# Patient Record
Sex: Male | Born: 1950 | Race: White | Hispanic: No | Marital: Married | Smoking: Former smoker
Health system: Southern US, Community
[De-identification: ages and names within clinical notes are randomized; demographics above are authoritative.]

## PROBLEM LIST (undated history)

## (undated) DIAGNOSIS — K469 Unspecified abdominal hernia without obstruction or gangrene: Secondary | ICD-10-CM

## (undated) DIAGNOSIS — I82409 Acute embolism and thrombosis of unspecified deep veins of unspecified lower extremity: Secondary | ICD-10-CM

## (undated) DIAGNOSIS — N200 Calculus of kidney: Secondary | ICD-10-CM

## (undated) DIAGNOSIS — I4891 Unspecified atrial fibrillation: Secondary | ICD-10-CM

## (undated) DIAGNOSIS — I10 Essential (primary) hypertension: Secondary | ICD-10-CM

## (undated) HISTORY — PX: KIDNEY STONE SURGERY: SHX686

## (undated) HISTORY — PX: HERNIA REPAIR: SHX51

---

## 1999-11-09 ENCOUNTER — Encounter: Payer: Self-pay | Admitting: Emergency Medicine

## 1999-11-09 ENCOUNTER — Encounter: Admission: RE | Admit: 1999-11-09 | Discharge: 1999-11-09 | Payer: Self-pay | Admitting: Emergency Medicine

## 2002-04-19 ENCOUNTER — Ambulatory Visit (HOSPITAL_COMMUNITY): Admission: RE | Admit: 2002-04-19 | Discharge: 2002-04-19 | Payer: Self-pay | Admitting: Emergency Medicine

## 2002-10-18 ENCOUNTER — Ambulatory Visit: Admission: RE | Admit: 2002-10-18 | Discharge: 2002-10-18 | Payer: Self-pay | Admitting: Emergency Medicine

## 2003-02-10 ENCOUNTER — Encounter: Admission: RE | Admit: 2003-02-10 | Discharge: 2003-02-10 | Payer: Self-pay | Admitting: Emergency Medicine

## 2003-02-10 ENCOUNTER — Encounter: Payer: Self-pay | Admitting: Emergency Medicine

## 2003-10-08 ENCOUNTER — Encounter (INDEPENDENT_AMBULATORY_CARE_PROVIDER_SITE_OTHER): Payer: Self-pay | Admitting: *Deleted

## 2003-10-08 ENCOUNTER — Ambulatory Visit (HOSPITAL_COMMUNITY): Admission: RE | Admit: 2003-10-08 | Discharge: 2003-10-08 | Payer: Self-pay | Admitting: *Deleted

## 2004-01-28 ENCOUNTER — Encounter: Admission: RE | Admit: 2004-01-28 | Discharge: 2004-01-28 | Payer: Self-pay | Admitting: Emergency Medicine

## 2004-01-29 ENCOUNTER — Emergency Department (HOSPITAL_COMMUNITY): Admission: EM | Admit: 2004-01-29 | Discharge: 2004-01-29 | Payer: Self-pay | Admitting: Emergency Medicine

## 2004-05-03 ENCOUNTER — Encounter: Admission: RE | Admit: 2004-05-03 | Discharge: 2004-05-03 | Payer: Self-pay | Admitting: Emergency Medicine

## 2009-05-25 ENCOUNTER — Emergency Department (HOSPITAL_BASED_OUTPATIENT_CLINIC_OR_DEPARTMENT_OTHER): Admission: EM | Admit: 2009-05-25 | Discharge: 2009-05-25 | Payer: Self-pay | Admitting: Emergency Medicine

## 2009-05-25 ENCOUNTER — Ambulatory Visit: Payer: Self-pay | Admitting: Diagnostic Radiology

## 2010-10-15 NOTE — Op Note (Signed)
NAME:  Peter Peters, Peter Peters                        ACCOUNT NO.:  [id number]   MEDICAL RECORD NO.:  [id number]                   PATIENT TYPE:  OIB   LOCATION:  2899                                 FACILITY:  [facility]   PHYSICIAN:  Balinda Quails, M.D.                 DATE OF BIRTH:  [birth year]   DATE OF PROCEDURE:  10/08/2003  DATE OF DISCHARGE:  10/08/2003                                 OPERATIVE REPORT   PREOPERATIVE DIAGNOSIS:  Varicose veins, right lower extremity.   POSTOPERATIVE DIAGNOSIS:  Varicose veins, right lower extremity.   PROCEDURES:  1. Ligation and stripping, right greater saphenous vein.  2. Ligation and stripping of multiple venous varicosities, right lower     extremity.   SURGEON:  Balinda Quails, M.D.   ASSISTANT:  Jerold Coombe, P.A.   ANESTHESIA:  General endotracheal.   ANESTHESIOLOGIST:  Sheldon Silvan, M.D.   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  Evaluation revealed extensive varicosities including right greater saphenous  incompetence.   He is brought to the operating room at this time for ligation and stripping  of the greater saphenous vein in the right lower extremity along with  multiple varicose tributaries.   PROCEDURE NOTE:  Patient brought to the operating room and stable condition.  Placed in the supine position.  General endotracheal anesthesia induced.  Right leg prepped and draped in a sterile fashion.   Oblique skin incision made in the right groin over the saphenofemoral  junction.  Dissection carried down through the subcutaneous tissue.  The  saphenofemoral junction exposed.  Tributaries of the saphenous vein ligated  with 3-0 silk and divided.  The saphenofemoral junction was clearly exposed.  The saphenous vein encircled with a 2-0 silk and ligated at the  saphenofemoral junction.   A skin incision was then made over the greater  saphenous vein in the  proximal calf.  Dissection carried down to expose the vein.  This was freed,  ligated distally with 2-0 silk.  The vein then opened transversely.  A  plastic stripper was passed up the vein, however could only be passed to the  mid-thigh level.  A transverse skin incision was made at the mid-thigh  level.  The saphenous vein further exposed.  The vein mobilized, stripper  brought through this level, and the segment stripped to the mid-thigh.  The  stripper was then placed proximally through the vein up to the  saphenofemoral junction.  The vein was divided at the saphenofemoral  junction and the proximal thigh portion was stripped completely.   Pre-marked sites were then opened through small transverse step-ladder  incisions and varicosities were stripped and ligated with 3-0 silk.   The wounds were irrigated with saline solution.  The groin incision closed  with running 3-0 Vicryl in a subcutaneous layer  and 4-0 Monocryl in the  skin.  The transverse incisions were closed with a single dermal layer of  interrupted 4-0 Vicryl suture.  Steri-Strips were applied.   A dressing of 4 x 4's, Kerlix, and Ace wrap was applied to the leg.   There were no apparent complications.  The patient transferred to the  recovery room in stable condition.                                               Balinda Quails, M.D.    [facility]MED[facility]  D:  11/13/2003  T:  11/13/2003  Job:  [id number]   cc:   Reuben Likes, M.D.  [address]  [place]  [place] [address]  Fax: [phone]

## 2014-03-31 ENCOUNTER — Emergency Department (HOSPITAL_BASED_OUTPATIENT_CLINIC_OR_DEPARTMENT_OTHER): Payer: BC Managed Care – PPO

## 2014-03-31 ENCOUNTER — Emergency Department (HOSPITAL_BASED_OUTPATIENT_CLINIC_OR_DEPARTMENT_OTHER)
Admission: EM | Admit: 2014-03-31 | Discharge: 2014-03-31 | Disposition: A | Discharge status: Home / Self Care | Payer: BC Managed Care – PPO | Attending: Emergency Medicine | Admitting: Emergency Medicine

## 2014-03-31 ENCOUNTER — Encounter (HOSPITAL_BASED_OUTPATIENT_CLINIC_OR_DEPARTMENT_OTHER): Payer: Self-pay

## 2014-03-31 DIAGNOSIS — R1031 Right lower quadrant pain: Secondary | ICD-10-CM

## 2014-03-31 DIAGNOSIS — K5792 Diverticulitis of intestine, part unspecified, without perforation or abscess without bleeding: Secondary | ICD-10-CM

## 2014-03-31 DIAGNOSIS — Z9889 Other specified postprocedural states: Secondary | ICD-10-CM | POA: Diagnosis not present

## 2014-03-31 DIAGNOSIS — Z87442 Personal history of urinary calculi: Secondary | ICD-10-CM | POA: Insufficient documentation

## 2014-03-31 HISTORY — DX: Calculus of kidney: N20.0

## 2014-03-31 LAB — URINALYSIS, ROUTINE W REFLEX MICROSCOPIC
Bilirubin Urine: NEGATIVE
Glucose, UA: NEGATIVE mg/dL
HGB URINE DIPSTICK: NEGATIVE
Ketones, ur: NEGATIVE mg/dL
Nitrite: NEGATIVE
PH: 7 (ref 5.0–8.0)
PROTEIN: NEGATIVE mg/dL
SPECIFIC GRAVITY, URINE: 1.021 (ref 1.005–1.030)
UROBILINOGEN UA: 1 mg/dL (ref 0.0–1.0)

## 2014-03-31 LAB — COMPREHENSIVE METABOLIC PANEL
ALT: 23 U/L (ref 0–53)
ANION GAP: 10 (ref 5–15)
AST: 20 U/L (ref 0–37)
Albumin: 4 g/dL (ref 3.5–5.2)
Alkaline Phosphatase: 59 U/L (ref 39–117)
BUN: 21 mg/dL (ref 6–23)
CALCIUM: 9.3 mg/dL (ref 8.4–10.5)
CO2: 26 mEq/L (ref 19–32)
Chloride: 105 mEq/L (ref 96–112)
Creatinine, Ser: 0.9 mg/dL (ref 0.50–1.35)
GFR calc Af Amer: >90 mL/min (ref >90)
Glucose, Bld: 98 mg/dL (ref 70–99)
POTASSIUM: 4.6 meq/L (ref 3.7–5.3)
SODIUM: 141 meq/L (ref 137–147)
Total Bilirubin: 0.6 mg/dL (ref 0.3–1.2)
Total Protein: 6.7 g/dL (ref 6.0–8.3)

## 2014-03-31 LAB — URINE MICROSCOPIC-ADD ON

## 2014-03-31 LAB — CBC WITH DIFFERENTIAL/PLATELET
BASOS ABS: 0 10*3/uL (ref 0.0–0.1)
BASOS PCT: 1 % (ref 0–1)
EOS ABS: 0.1 10*3/uL (ref 0.0–0.7)
Eosinophils Relative: 2 % (ref 0–5)
HCT: 43.4 % (ref 39.0–52.0)
HEMOGLOBIN: 15.6 g/dL (ref 13.0–17.0)
LYMPHS ABS: 1.7 10*3/uL (ref 0.7–4.0)
Lymphocytes Relative: 23 % (ref 12–46)
MCH: 30.6 pg (ref 26.0–34.0)
MCHC: 35.9 g/dL (ref 30.0–36.0)
MCV: 85.3 fL (ref 78.0–100.0)
MONOS PCT: 7 % (ref 3–12)
Monocytes Absolute: 0.5 10*3/uL (ref 0.1–1.0)
NEUTROS ABS: 4.8 10*3/uL (ref 1.7–7.7)
NEUTROS PCT: 67 % (ref 43–77)
PLATELETS: 209 10*3/uL (ref 150–400)
RBC: 5.09 MIL/uL (ref 4.22–5.81)
RDW: 13 % (ref 11.5–15.5)
WBC: 7.1 10*3/uL (ref 4.0–10.5)

## 2014-03-31 MED ORDER — IOHEXOL 300 MG/ML  SOLN
50.0000 mL | Freq: Once | INTRAMUSCULAR | Status: AC | PRN
  Administered 2014-03-31: 50 mL via ORAL

## 2014-03-31 MED ORDER — IOHEXOL 300 MG/ML  SOLN
100.0000 mL | Freq: Once | INTRAMUSCULAR | Status: AC | PRN
  Administered 2014-03-31: 100 mL via INTRAVENOUS

## 2014-03-31 MED ORDER — HYDROCODONE-ACETAMINOPHEN 5-325 MG PO TABS: 1.0000 | ORAL_TABLET | Freq: Four times a day (QID) | ORAL | Status: DC | PRN

## 2014-03-31 MED ORDER — CIPROFLOXACIN HCL 500 MG PO TABS: 500.0000 mg | ORAL_TABLET | Freq: Two times a day (BID) | ORAL | Status: DC

## 2014-03-31 MED ORDER — METRONIDAZOLE 500 MG PO TABS: 500.0000 mg | ORAL_TABLET | Freq: Three times a day (TID) | ORAL | Status: DC

## 2014-03-31 NOTE — ED Notes (Signed)
RLQ pain started 10pm last night-denies n/v/d

## 2014-03-31 NOTE — ED Provider Notes (Signed)
CSN: [id number]     Arrival date & time 03/31/14  1409 History   First MD Initiated Contact with Patient 03/31/14 1418     Chief Complaint  Patient presents with  . Abdominal Pain     (Consider location/radiation/quality/duration/timing/severity/associated sxs/prior Treatment) HPI Comments: Patient is a 63 year old male with history of renal calculi and hernia repair. He presents today with complaints of pain in his right lower quadrant that started yesterday evening. This is worsened into this morning. He denies any fevers or chills. He denies any nausea, vomiting, or diarrhea. This does not feel like a kidney stone.  Patient is a 63 y.o. male presenting with abdominal pain. The history is provided by the patient.  Abdominal Pain Pain location:  RLQ Pain quality: cramping   Pain radiates to:  Does not radiate Pain severity:  Moderate Onset quality:  Gradual Duration:  12 hours Timing:  Constant Progression:  Worsening Chronicity:  New Relieved by:  Nothing Worsened by:  Nothing tried Ineffective treatments:  None tried   Past Medical History  Diagnosis Date  . Kidney stone    Past Surgical History  Procedure Laterality Date  . Hernia repair     No family history on file. History  Substance Use Topics  . Smoking status: Never Smoker   . Smokeless tobacco: Not on file  . Alcohol Use: Yes    Review of Systems  Gastrointestinal: Positive for abdominal pain.  All other systems reviewed and are negative.     Allergies  Review of patient's allergies indicates no known allergies.  Home Medications   Prior to Admission medications   Not on File   BP 137/91 mmHg  Pulse 99  Temp(Src) 98 F (36.7 C) (Oral)  Resp 18  Ht 6' (1.829 m)  Wt 220 lb (99.791 kg)  BMI 29.83 kg/m2  SpO2 98% Physical Exam  Constitutional: He is oriented to person, place, and time. He appears well-developed and well-nourished. No distress.  HENT:  Head: Normocephalic and atraumatic.   Neck: Normal range of motion. Neck supple.  Cardiovascular: Normal rate and regular rhythm.   No murmur heard. Pulmonary/Chest: Effort normal and breath sounds normal. No respiratory distress. He has no wheezes.  Abdominal: Soft. Bowel sounds are normal. He exhibits no distension. There is tenderness. There is no rebound and no guarding.  There is tenderness to palpation in the right lower quadrant.  Musculoskeletal: Normal range of motion.  Neurological: He is alert and oriented to person, place, and time.  Skin: Skin is warm and dry. He is not diaphoretic.  Nursing note and vitals reviewed.   ED Course  Procedures (including critical care time) Labs Review Labs Reviewed  COMPREHENSIVE METABOLIC PANEL  CBC WITH DIFFERENTIAL  URINALYSIS, ROUTINE W REFLEX MICROSCOPIC    Imaging Review No results found.   EKG Interpretation None      MDM   Final diagnoses:  None    Patient is a 63 year old male who presents with right lower quadrant pain since last night. He has no fever and no white count. CT scan reveals, however is suggestive of diverticulitis. He will be treated with Cipro and Flagyl and when necessary follow-up with his primary doctor. He understands to return if his symptoms worsen or change.    Geoffery Lyonsouglas Dontrail Blackwell, MD 03/31/14 [phone]

## 2014-03-31 NOTE — Discharge Instructions (Signed)
Cipro and Flagyl as prescribed.  Hydrocodone as prescribed as needed for pain.  Return to the emergency department if you develop worsening pain, high fever, bloody stool, or other new and concerning symptoms.   Diverticulitis Diverticulitis is inflammation or infection of small pouches in your colon that form when you have a condition called diverticulosis. The pouches in your colon are called diverticula. Your colon, or large intestine, is where water is absorbed and stool is formed. Complications of diverticulitis can include:  Bleeding.  Severe infection.  Severe pain.  Perforation of your colon.  Obstruction of your colon. CAUSES  Diverticulitis is caused by bacteria. Diverticulitis happens when stool becomes trapped in diverticula. This allows bacteria to grow in the diverticula, which can lead to inflammation and infection. RISK FACTORS People with diverticulosis are at risk for diverticulitis. Eating a diet that does not include enough fiber from fruits and vegetables may make diverticulitis more likely to develop. SYMPTOMS  Symptoms of diverticulitis may include:  Abdominal pain and tenderness. The pain is normally located on the left side of the abdomen, but may occur in other areas.  Fever and chills.  Bloating.  Cramping.  Nausea.  Vomiting.  Constipation.  Diarrhea.  Blood in your stool. DIAGNOSIS  Your health care provider will ask you about your medical history and do a physical exam. You may need to have tests done because many medical conditions can cause the same symptoms as diverticulitis. Tests may include:  Blood tests.  Urine tests.  Imaging tests of the abdomen, including X-rays and CT scans. When your condition is under control, your health care provider may recommend that you have a colonoscopy. A colonoscopy can show how severe your diverticula are and whether something else is causing your symptoms. TREATMENT  Most cases of  diverticulitis are mild and can be treated at home. Treatment may include:  Taking over-the-counter pain medicines.  Following a clear liquid diet.  Taking antibiotic medicines by mouth for 7-10 days. More severe cases may be treated at a hospital. Treatment may include:  Not eating or drinking.  Taking prescription pain medicine.  Receiving antibiotic medicines through an IV tube.  Receiving fluids and nutrition through an IV tube.  Surgery. HOME CARE INSTRUCTIONS   Follow your health care provider's instructions carefully.  Follow a full liquid diet or other diet as directed by your health care provider. After your symptoms improve, your health care provider may tell you to change your diet. He or she may recommend you eat a high-fiber diet. Fruits and vegetables are good sources of fiber. Fiber makes it easier to pass stool.  Take fiber supplements or probiotics as directed by your health care provider.  Only take medicines as directed by your health care provider.  Keep all your follow-up appointments. SEEK MEDICAL CARE IF:   Your pain does not improve.  You have a hard time eating food.  Your bowel movements do not return to normal. SEEK IMMEDIATE MEDICAL CARE IF:   Your pain becomes worse.  Your symptoms do not get better.  Your symptoms suddenly get worse.  You have a fever.  You have repeated vomiting.  You have bloody or black, tarry stools. MAKE SURE YOU:   Understand these instructions.  Will watch your condition.  Will get help right away if you are not doing well or get worse. Document Released: 02/23/2005 Document Revised: 05/21/2013 Document Reviewed: 04/10/2013 [facility] Patient Information 2015 [employer] [place]. This information is not intended to replace  advice given to you by your health care provider. Make sure you discuss any questions you have with your health care provider. ° °

## 2014-09-25 ENCOUNTER — Emergency Department (HOSPITAL_BASED_OUTPATIENT_CLINIC_OR_DEPARTMENT_OTHER)
Admission: EM | Admit: 2014-09-25 | Discharge: 2014-09-25 | Disposition: A | Discharge status: Home / Self Care | Payer: Worker's Compensation | Attending: Emergency Medicine | Admitting: Emergency Medicine

## 2014-09-25 ENCOUNTER — Encounter (HOSPITAL_BASED_OUTPATIENT_CLINIC_OR_DEPARTMENT_OTHER): Payer: Self-pay | Admitting: Emergency Medicine

## 2014-09-25 DIAGNOSIS — Z8719 Personal history of other diseases of the digestive system: Secondary | ICD-10-CM | POA: Insufficient documentation

## 2014-09-25 DIAGNOSIS — S81812A Laceration without foreign body, left lower leg, initial encounter: Secondary | ICD-10-CM | POA: Insufficient documentation

## 2014-09-25 DIAGNOSIS — Y998 Other external cause status: Secondary | ICD-10-CM | POA: Insufficient documentation

## 2014-09-25 DIAGNOSIS — Y9389 Activity, other specified: Secondary | ICD-10-CM | POA: Insufficient documentation

## 2014-09-25 DIAGNOSIS — S80812A Abrasion, left lower leg, initial encounter: Secondary | ICD-10-CM | POA: Diagnosis not present

## 2014-09-25 DIAGNOSIS — Z792 Long term (current) use of antibiotics: Secondary | ICD-10-CM | POA: Insufficient documentation

## 2014-09-25 DIAGNOSIS — IMO0002 Reserved for concepts with insufficient information to code with codable children: Secondary | ICD-10-CM

## 2014-09-25 DIAGNOSIS — W01198A Fall on same level from slipping, tripping and stumbling with subsequent striking against other object, initial encounter: Secondary | ICD-10-CM | POA: Diagnosis not present

## 2014-09-25 DIAGNOSIS — Z86718 Personal history of other venous thrombosis and embolism: Secondary | ICD-10-CM | POA: Insufficient documentation

## 2014-09-25 DIAGNOSIS — Z7982 Long term (current) use of aspirin: Secondary | ICD-10-CM | POA: Diagnosis not present

## 2014-09-25 DIAGNOSIS — Z87442 Personal history of urinary calculi: Secondary | ICD-10-CM | POA: Insufficient documentation

## 2014-09-25 DIAGNOSIS — Y9289 Other specified places as the place of occurrence of the external cause: Secondary | ICD-10-CM | POA: Diagnosis not present

## 2014-09-25 DIAGNOSIS — Z87891 Personal history of nicotine dependence: Secondary | ICD-10-CM | POA: Diagnosis not present

## 2014-09-25 DIAGNOSIS — Z23 Encounter for immunization: Secondary | ICD-10-CM | POA: Insufficient documentation

## 2014-09-25 HISTORY — DX: Unspecified abdominal hernia without obstruction or gangrene: K46.9

## 2014-09-25 HISTORY — DX: Acute embolism and thrombosis of unspecified deep veins of unspecified lower extremity: I82.409

## 2014-09-25 MED ORDER — TETANUS-DIPHTH-ACELL PERTUSSIS 5-2.5-18.5 LF-MCG/0.5 IM SUSP
0.5000 mL | Freq: Once | INTRAMUSCULAR | Status: AC
  Administered 2014-09-25: 0.5 mL via INTRAMUSCULAR
  Filled 2014-09-25: qty 0.5

## 2014-09-25 MED ORDER — LIDOCAINE-EPINEPHRINE-TETRACAINE (LET) SOLUTION
3.0000 mL | Freq: Once | NASAL | Status: AC
  Administered 2014-09-25: 3 mL via TOPICAL
  Filled 2014-09-25: qty 3

## 2014-09-25 NOTE — ED Notes (Signed)
Pt tripped over metal stool lacerating left ankle.

## 2014-09-25 NOTE — ED Provider Notes (Signed)
CSN: [id number]     Arrival date & time 09/25/14  0056 History   First MD Initiated Contact with Patient 09/25/14 0104     Chief Complaint  Patient presents with  . Extremity Laceration     (Consider location/radiation/quality/duration/timing/severity/associated sxs/prior Treatment) Patient is a 64 y.o. male presenting with skin laceration. The history is provided by the patient.  Laceration Location:  Leg Leg laceration location:  L lower leg Length (cm):  3.5 Depth:  Through dermis Quality: straight   Bleeding: controlled   Laceration mechanism:  Fall Pain details:    Quality:  Aching   Severity:  Mild   Timing:  Constant   Progression:  Unchanged Relieved by:  Nothing Worsened by:  Nothing tried Ineffective treatments:  None tried fell over a metal stool at work.    Past Medical History  Diagnosis Date  . Kidney stone   . Hernia of abdominal cavity   . DVT (deep venous thrombosis)    Past Surgical History  Procedure Laterality Date  . Hernia repair    . Kidney stone surgery     History reviewed. No pertinent family history. History  Substance Use Topics  . Smoking status: Former Games developer  . Smokeless tobacco: Not on file  . Alcohol Use: Yes    Review of Systems  All other systems reviewed and are negative.     Allergies  Review of patient's allergies indicates no known allergies.  Home Medications   Prior to Admission medications   Medication Sig Start Date End Date Taking? Authorizing Provider  aspirin 81 MG tablet Take 164 mg by mouth daily.   Yes Historical Provider, MD  ciprofloxacin (CIPRO) 500 MG tablet Take 1 tablet (500 mg total) by mouth 2 (two) times daily. One po bid x 7 days 03/31/14   Geoffery Lyons, MD  HYDROcodone-acetaminophen [facility]) 5-325 MG per tablet Take 1-2 tablets by mouth every 6 (six) hours as needed. 03/31/14   Geoffery Lyons, MD  metroNIDAZOLE (FLAGYL) 500 MG tablet Take 1 tablet (500 mg total) by mouth 3 (three) times daily. One  po bid x 7 days 03/31/14   Geoffery Lyons, MD   BP 186/82 mmHg  Pulse 84  Temp(Src) 98.9 F (37.2 C) (Oral)  Resp 16  Ht 6' (1.829 m)  Wt 218 lb (98.884 kg)  BMI 29.56 kg/m2  SpO2 99% Physical Exam  Constitutional: He is oriented to person, place, and time. He appears well-developed and well-nourished. No distress.  HENT:  Head: Normocephalic and atraumatic.  Mouth/Throat: Oropharynx is clear and moist.  Eyes: Conjunctivae are normal. Pupils are equal, round, and reactive to light.  Neck: Normal range of motion. Neck supple.  Cardiovascular: Normal rate, regular rhythm and intact distal pulses.   Pulmonary/Chest: Effort normal and breath sounds normal. No respiratory distress. He has no wheezes. He has no rales.  Abdominal: Soft. Bowel sounds are normal. There is no tenderness. There is no rebound and no guarding.  Musculoskeletal: Normal range of motion.  Neurological: He is alert and oriented to person, place, and time.  Skin: Skin is warm and dry.     Psychiatric: He has a normal mood and affect.    ED Course  Procedures (including critical care time) Labs Review Labs Reviewed - No data to display  Imaging Review No results found.   EKG Interpretation None      MDM   Final diagnoses:  None    LACERATION REPAIR Performed by: Jasmine Awe Authorized by: Deanna Artis  K Consent: Verbal consent obtained. Risks and benefits: risks, benefits and alternatives were discussed Consent given by: patient Patient identity confirmed: provided demographic data Prepped and Draped in normal sterile fashion Wound explored  Laceration Location: distal left shin just above the ankle  Laceration Length:  3.5 cm  No Foreign Bodies seen or palpated  Anesthesia: topical LET  Irrigation method: syringe Amount of cleaning: standard  Skin closure: staples  Number of sutures:7   Patient tolerance: Patient tolerated the procedure well with no immediate  complications.   Staple removal at urgent care in 12 days  Abimael Zeiter, MD 09/25/14 0230

## 2014-09-25 NOTE — ED Notes (Signed)
MD at bedside. 

## 2014-10-03 NOTE — ED Notes (Signed)
Patient called to state that he was seen approximately one week ago and received staples for a laceration on his ankle.  States over the last few days he has noticed a pus drainage coming from the wound with redness around the site.  Not sure if he needs to have it looked at or not and where to go.  Encouraged to have the wound checked today, [facility] [place], or by his PCP.  Verbalized understanding.

## 2015-06-03 MED FILL — ELIQUIS 5 MG TABLET: 5 | 30 days supply | Qty: 60 | Fill #2

## 2015-06-22 MED FILL — traMADol HCL 50 MG TABS: 50 | 5 days supply | Qty: 30 | Fill #0

## 2015-06-30 MED FILL — POTASSIUM CITRATE ER 15 MEQ: 15 MEQ | 30 days supply | Qty: 60 | Fill #0

## 2015-07-13 MED FILL — ELIQUIS 5 MG TABLET: 5 | 30 days supply | Qty: 60 | Fill #3

## 2015-08-10 MED FILL — POTASSIUM CITRATE ER 15 MEQ: 15 MEQ | 30 days supply | Qty: 60 | Fill #1

## 2015-08-14 MED FILL — ELIQUIS 5 MG TABLET: 5 | 30 days supply | Qty: 60 | Fill #4

## 2015-09-30 MED FILL — ELIQUIS 5 MG TABLET: 5 | 30 days supply | Qty: 60 | Fill #0

## 2015-10-01 MED FILL — DILTIAZEM 24HR ER 240 MG CA: 240 | 30 days supply | Qty: 30 | Fill #0

## 2015-10-27 MED FILL — ELIQUIS 5 MG TABLET: 5 | 30 days supply | Qty: 60 | Fill #0

## 2015-11-02 MED FILL — POTASSIUM CITRATE ER 15 MEQ: 15 MEQ | 30 days supply | Qty: 60 | Fill #2

## 2015-12-03 MED FILL — DILTIAZEM 24HR ER 240 MG CA: 240 | 30 days supply | Qty: 30 | Fill #1

## 2015-12-03 MED FILL — POTASSIUM CITRATE ER 15 MEQ: 15 MEQ | 30 days supply | Qty: 60 | Fill #3 | Status: TO

## 2015-12-03 MED FILL — ELIQUIS 5 MG TABLET: 5 | 30 days supply | Qty: 60 | Fill #0 | Status: TO

## 2017-11-09 DIAGNOSIS — E782 Mixed hyperlipidemia: Secondary | ICD-10-CM | POA: Diagnosis present

## 2018-04-28 ENCOUNTER — Emergency Department (HOSPITAL_BASED_OUTPATIENT_CLINIC_OR_DEPARTMENT_OTHER): Payer: BLUE CROSS/BLUE SHIELD

## 2018-04-28 ENCOUNTER — Other Ambulatory Visit: Payer: Self-pay

## 2018-04-28 ENCOUNTER — Emergency Department (HOSPITAL_BASED_OUTPATIENT_CLINIC_OR_DEPARTMENT_OTHER)
Admission: EM | Admit: 2018-04-28 | Discharge: 2018-04-28 | Disposition: A | Discharge status: Home / Self Care | Payer: BLUE CROSS/BLUE SHIELD | Attending: Emergency Medicine | Admitting: Emergency Medicine

## 2018-04-28 ENCOUNTER — Encounter (HOSPITAL_BASED_OUTPATIENT_CLINIC_OR_DEPARTMENT_OTHER): Payer: Self-pay | Admitting: Emergency Medicine

## 2018-04-28 DIAGNOSIS — W2209XA Striking against other stationary object, initial encounter: Secondary | ICD-10-CM | POA: Insufficient documentation

## 2018-04-28 DIAGNOSIS — Y929 Unspecified place or not applicable: Secondary | ICD-10-CM | POA: Insufficient documentation

## 2018-04-28 DIAGNOSIS — Z7982 Long term (current) use of aspirin: Secondary | ICD-10-CM | POA: Diagnosis not present

## 2018-04-28 DIAGNOSIS — S0001XA Abrasion of scalp, initial encounter: Secondary | ICD-10-CM | POA: Insufficient documentation

## 2018-04-28 DIAGNOSIS — T148XXA Other injury of unspecified body region, initial encounter: Secondary | ICD-10-CM

## 2018-04-28 DIAGNOSIS — S0990XA Unspecified injury of head, initial encounter: Secondary | ICD-10-CM

## 2018-04-28 DIAGNOSIS — Y9389 Activity, other specified: Secondary | ICD-10-CM | POA: Insufficient documentation

## 2018-04-28 DIAGNOSIS — Y999 Unspecified external cause status: Secondary | ICD-10-CM | POA: Diagnosis not present

## 2018-04-28 DIAGNOSIS — I10 Essential (primary) hypertension: Secondary | ICD-10-CM | POA: Insufficient documentation

## 2018-04-28 HISTORY — DX: Unspecified atrial fibrillation: I48.91

## 2018-04-28 HISTORY — DX: Essential (primary) hypertension: I10

## 2018-04-28 NOTE — ED Triage Notes (Signed)
Patient states that he hit his head on the lift gait of his car about 1330 today - the patient reports that he is on blood thinners

## 2018-04-28 NOTE — ED Provider Notes (Signed)
[facility] EMERGENCY DEPARTMENT Provider Note   CSN: [id number] Arrival date & time: 04/28/18  1804     History   Chief Complaint Chief Complaint  Patient presents with  . Head Injury    HPI Peter Peters is a 67 y.o. male.  Patient is a 67 year old male who presents with a head injury.  He states he ran into a lift gate of a car.  This happened about 5 hours ago.  He had no loss of consciousness.  He is on aspirin but no other anticoagulants.  He has had no nausea or vomiting.  He does have a headache to the frontal scalp area.  He has an abrasion to the area.  He states that he consulted with the family member who is a physician who advised him to get a CT scan of his head.  He denies any dizziness.  No other injuries.     Past Medical History:  Diagnosis Date  . Atrial fibrillation (HCC)   . DVT (deep venous thrombosis) (HCC)   . Hernia of abdominal cavity   . Hypertension   . Kidney stone     There are no active problems to display for this patient.   Past Surgical History:  Procedure Laterality Date  . HERNIA REPAIR    . KIDNEY STONE SURGERY          Home Medications    Prior to Admission medications   Medication Sig Start Date End Date Taking? Authorizing Provider  atenolol (TENORMIN) 25 MG tablet Take by mouth. 10/03/16  Yes [provider]  atorvastatin (LIPITOR) 20 MG tablet Take by mouth. 11/27/17  Yes [provider]  Potassium Citrate 15 MEQ (1620 MG) TBCR Take by mouth. 05/09/17  Yes [provider]  aspirin 81 MG tablet Take 164 mg by mouth daily.    [provider]  ciprofloxacin (CIPRO) 500 MG tablet Take 1 tablet (500 mg total) by mouth 2 (two) times daily. One po bid x 7 days 03/31/14   Geoffery Lyonselo, [place], MD  HYDROcodone-acetaminophen (NORCO) 5-325 MG per tablet Take 1-2 tablets by mouth every 6 (six) hours as needed. 03/31/14   Geoffery Lyonselo, [place], MD  metroNIDAZOLE (FLAGYL) 500 MG tablet Take 1 tablet (500  mg total) by mouth 3 (three) times daily. One po bid x 7 days 03/31/14   Geoffery Lyonselo, Douglas, MD    Family History History reviewed. No pertinent family history.  Social History Social History   Tobacco Use  . Smoking status: Former Games developermoker  . Smokeless tobacco: Never Used  Substance Use Topics  . Alcohol use: Yes  . Drug use: No     Allergies   Patient has no known allergies.   Review of Systems Review of Systems  Constitutional: Negative for chills, diaphoresis, fatigue and fever.  HENT: Negative for congestion, rhinorrhea and sneezing.   Eyes: Negative.   Respiratory: Negative for cough, chest tightness and shortness of breath.   Cardiovascular: Negative for chest pain and leg swelling.  Gastrointestinal: Negative for abdominal pain, blood in stool, diarrhea, nausea and vomiting.  Genitourinary: Negative for difficulty urinating, flank pain, frequency and hematuria.  Musculoskeletal: Negative for arthralgias and back pain.  Skin: Positive for wound. Negative for rash.  Neurological: Positive for headaches. Negative for dizziness, speech difficulty, weakness and numbness.     Physical Exam Updated Vital Signs BP 140/80 (BP Location: Left Arm)   Pulse 78   Temp 99.1 F (37.3 C) (Oral)   Resp  18   Ht 6' (1.829 m)   Wt 103.4 kg   SpO2 100%   BMI 30.92 kg/m   Physical Exam  Constitutional: He is oriented to person, place, and time. He appears well-developed and well-nourished.  HENT:  Head: Normocephalic.  Superficial linear laceration to the frontal scalp area.  There is no active bleeding.  Eyes: Pupils are equal, round, and reactive to light.  Neck: Normal range of motion. Neck supple.  No pain along the spine  Cardiovascular: Normal rate, regular rhythm and normal heart sounds.  Pulmonary/Chest: Effort normal and breath sounds normal. No respiratory distress. He has no wheezes. He has no rales. He exhibits no tenderness.  Abdominal: Soft. Bowel sounds are normal.  There is no tenderness. There is no rebound and no guarding.  Musculoskeletal: Normal range of motion. He exhibits no edema.  Lymphadenopathy:    He has no cervical adenopathy.  Neurological: He is alert and oriented to person, place, and time.  Motor 5/5 all extremities CN II-XII grossly intact Gait normal   Skin: Skin is warm and dry. No rash noted.  Psychiatric: He has a normal mood and affect.     ED Treatments / Results  Labs (all labs ordered are listed, but only abnormal results are displayed) Labs Reviewed - No data to display  EKG None  Radiology Ct Head Wo Contrast  Result Date: 04/28/2018 CLINICAL DATA:  Left head injury earlier today with scalp laceration. EXAM: CT HEAD WITHOUT CONTRAST TECHNIQUE: Contiguous axial images were obtained from the base of the skull through the vertex without intravenous contrast. COMPARISON:  05/25/2009 head CT. FINDINGS: Brain: No evidence of parenchymal hemorrhage or extra-axial fluid collection. No mass lesion, mass effect, or midline shift. No CT evidence of acute infarction. Generalized cerebral volume loss. Nonspecific mild subcortical and periventricular white matter hypodensity, most in keeping with chronic small vessel ischemic change. No ventriculomegaly. Vascular: No acute abnormality. Skull: No evidence of calvarial fracture. Sinuses/Orbits: The visualized paranasal sinuses are essentially clear. Other: Small anterior upper frontal scalp contusion with minimal scalp emphysema and no radiopaque foreign body. The mastoid air cells are unopacified. IMPRESSION: 1. Small anterior upper frontal scalp contusion with minimal scalp emphysema and no radiopaque foreign body. No evidence of calvarial fracture. 2.  No evidence of acute intracranial abnormality. 3. Generalized cerebral volume loss and mild chronic small vessel ischemic changes in the cerebral white matter. Electronically Signed   By: Delbert Phenix M.D.   On: 04/28/2018 19:15     Procedures Procedures (including critical care time)  Medications Ordered in ED Medications - No data to display   Initial Impression / Assessment and Plan / ED Course  I have reviewed the triage vital signs and the nursing notes.  Pertinent labs & imaging results that were available during my care of the patient were reviewed by me and considered in my medical decision making (see chart for details).     CT scan shows no evidence of acute abnormality.  His tetanus shot is up-to-date.  His wound was clean.  It does not appear to need suturing.  He was discharged home in good condition.  He was given head injury precautions and wound care instructions.  Return precautions were given.  Final Clinical Impressions(s) / ED Diagnoses   Final diagnoses:  Injury of head, initial encounter  Abrasion    ED Discharge Orders    None       Rolan Bucco, MD 04/28/18 1930

## 2018-04-28 NOTE — ED Notes (Signed)
Pt verbalized understanding of discharge instructions. Signature pad not working 

## 2019-07-17 DIAGNOSIS — R7303 Prediabetes: Secondary | ICD-10-CM | POA: Diagnosis present

## 2019-07-17 DIAGNOSIS — N401 Enlarged prostate with lower urinary tract symptoms: Secondary | ICD-10-CM | POA: Diagnosis present

## 2019-07-30 IMAGING — CT CT HEAD W/O CM
3 series · 16 of 47 positions shown, 19 images · non-contrast
Comparison: 05/25/2009 head CT.

CLINICAL DATA: Left head injury earlier today with scalp
laceration.

EXAM:
CT HEAD WITHOUT CONTRAST
TECHNIQUE: Contiguous axial images were obtained from the base of the skull
through the vertex without intravenous contrast.

[Series 2: head wo · axial · 0.46mm/px · z∈[-167,-32]mm · 10 of 33 slices shown, 13 images]
[im 3/33  brain]
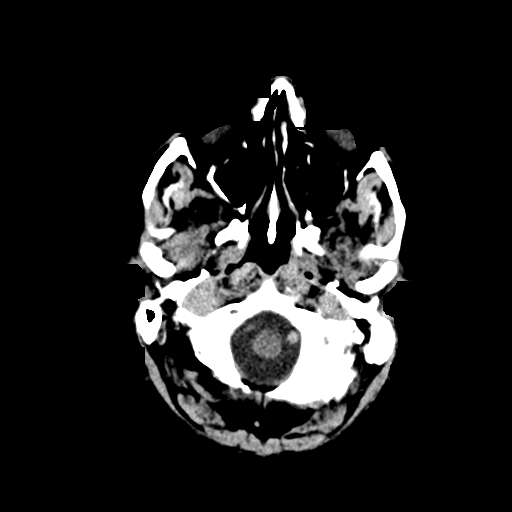
[im 3/33  bone]
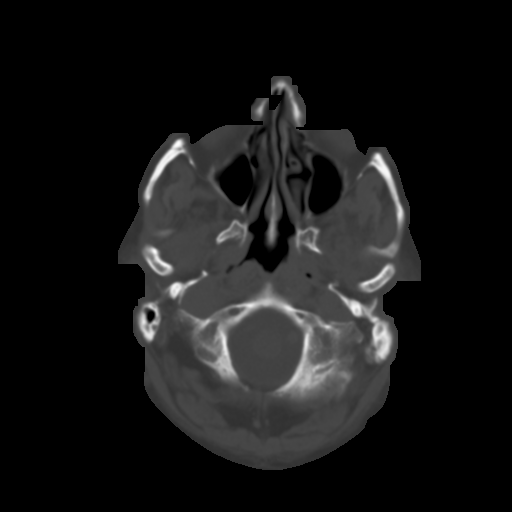
[im 6/33  brain]
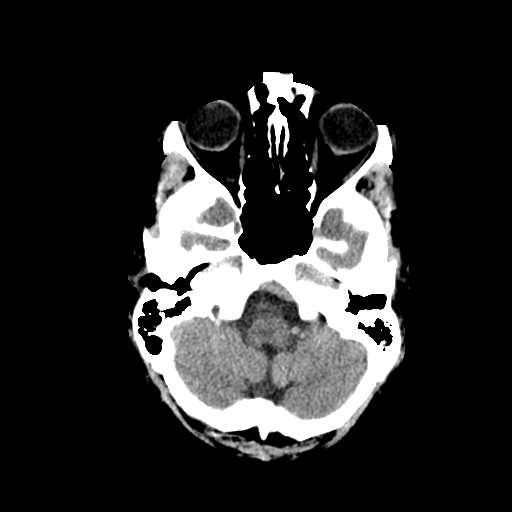
[im 9/33  brain]
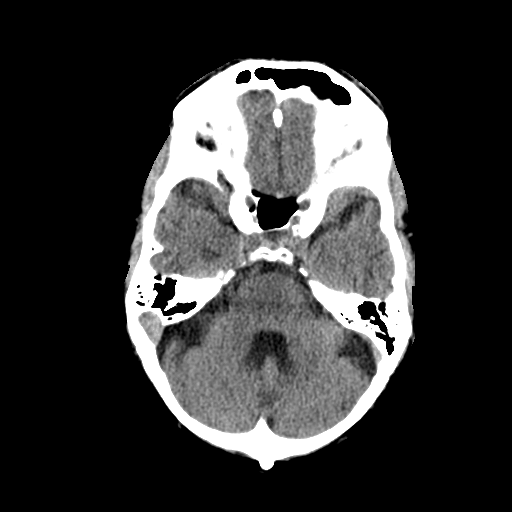
[im 12/33  brain]
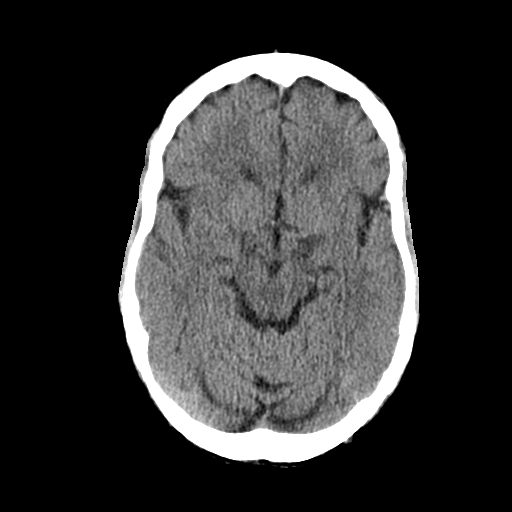
[im 15/33  brain]
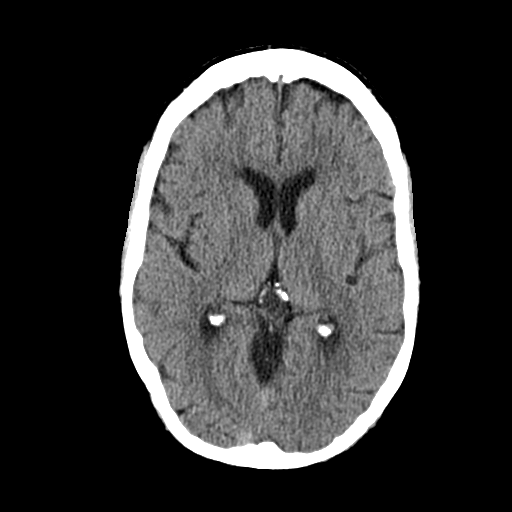
[im 15/33  bone]
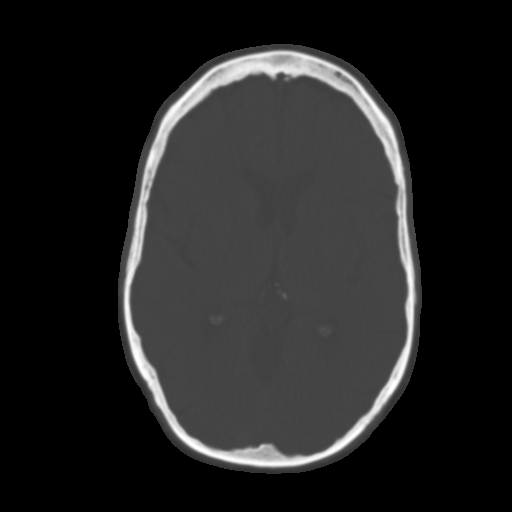
[im 18/33  brain]
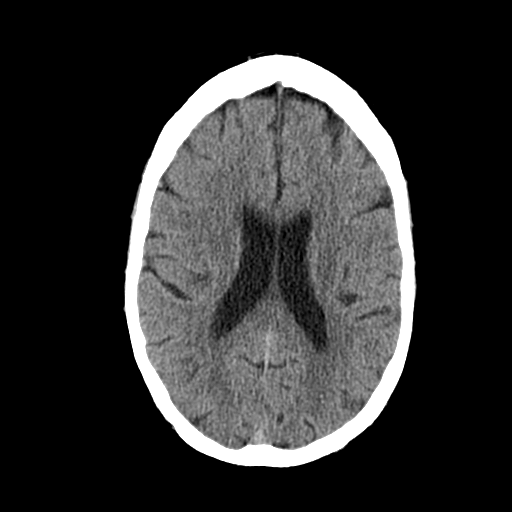
[im 21/33  brain]
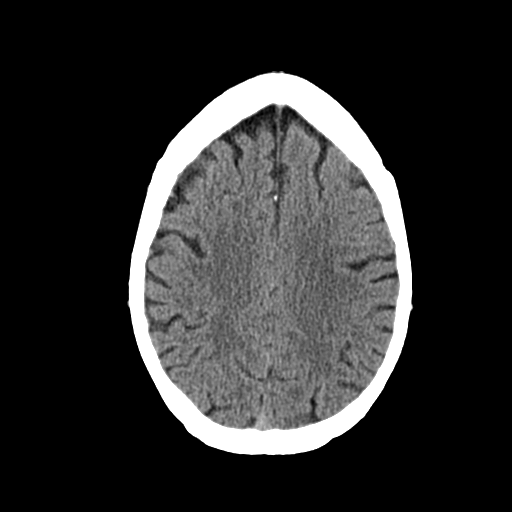
[im 25/33  brain]
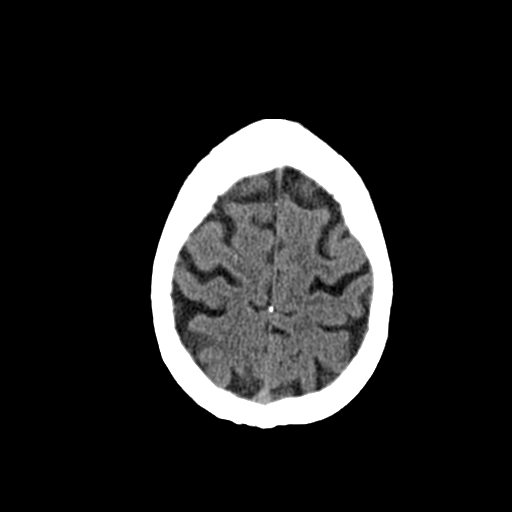
[im 27/33  brain]
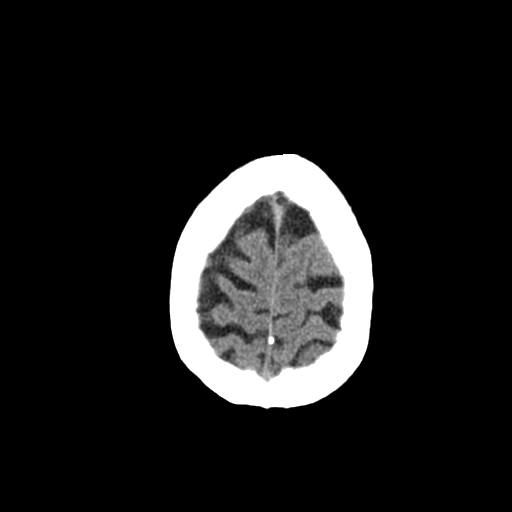
[im 27/33  bone]
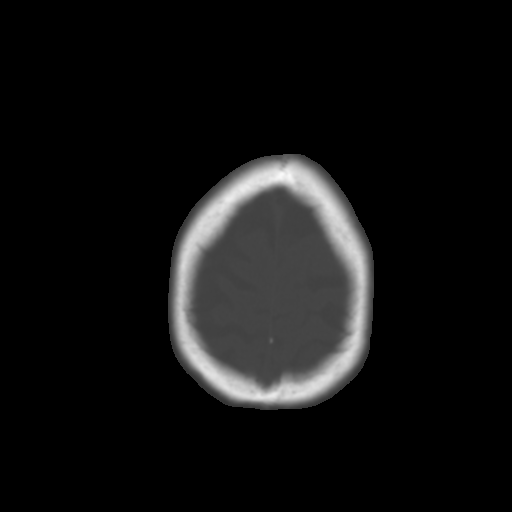
[im 30/33  brain]
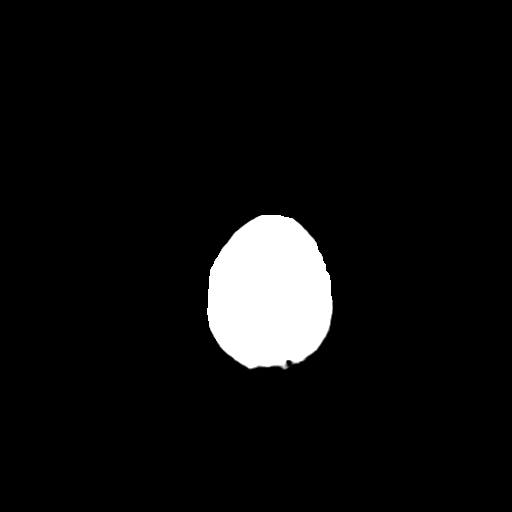

[Series 4: cor soft · coronal · 0.33mm/px · 3 of 69 slices shown]
[im 23/69  brain]
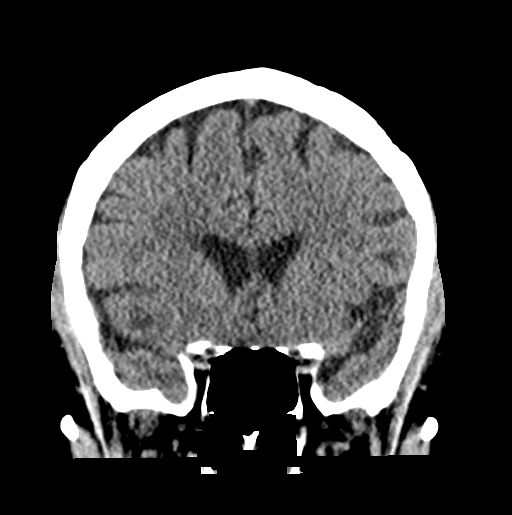
[im 31/69  brain]
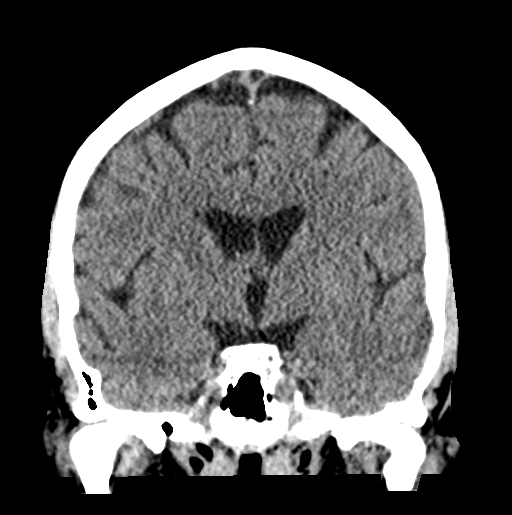
[im 38/69  brain]
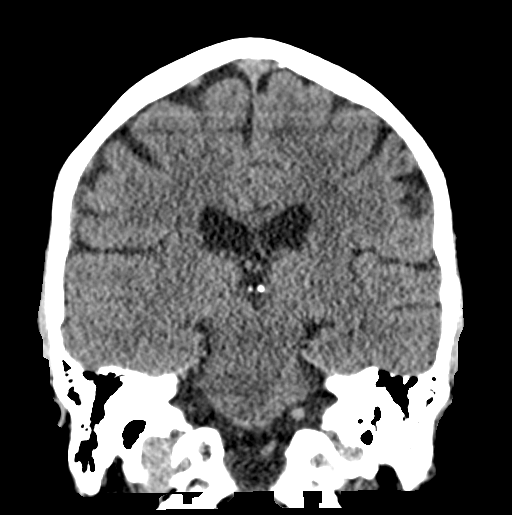

[Series 5: sag soft · sagittal · 0.33mm/px · 3 of 51 slices shown]
[im 17/51  brain]
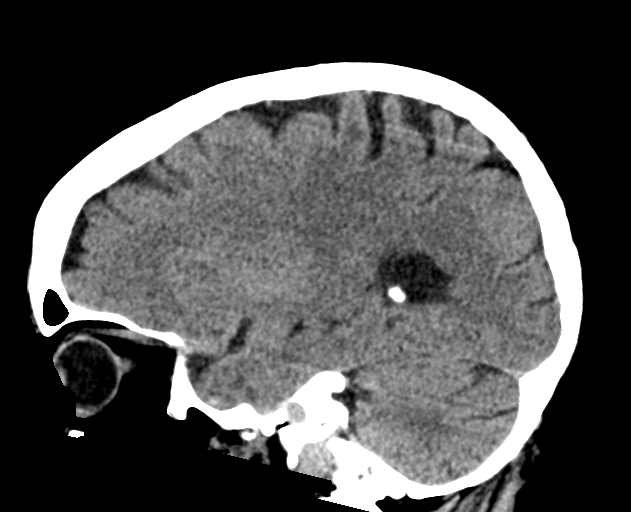
[im 26/51  brain]
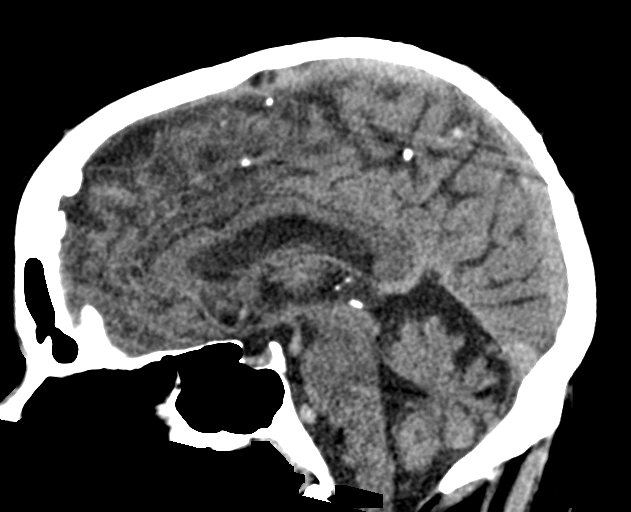
[im 34/51  brain]
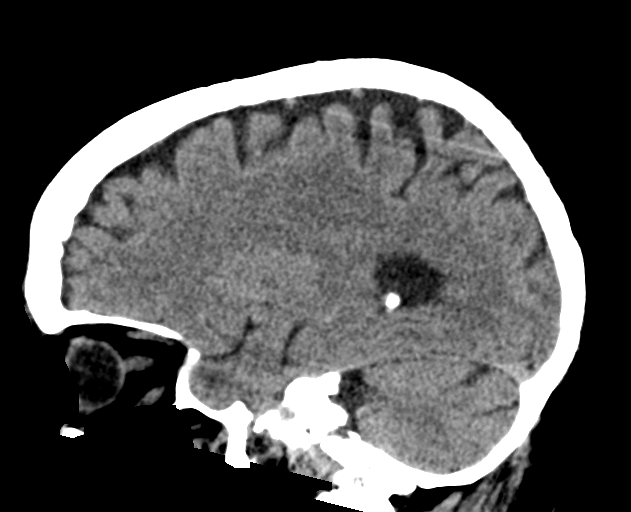

[16 of 47 positions shown; findings below may reference images not displayed]

FINDINGS: Brain: No evidence of parenchymal hemorrhage or extra-axial fluid
collection. No mass lesion, mass effect, or midline shift. No CT
evidence of acute infarction. Generalized cerebral volume loss.
Nonspecific mild subcortical and periventricular white matter
hypodensity, most in keeping with chronic small vessel ischemic
change. No ventriculomegaly.

Vascular: No acute abnormality.

Skull: No evidence of calvarial fracture.

Sinuses/Orbits: The visualized paranasal sinuses are essentially
clear.

Other: Small anterior upper frontal scalp contusion with minimal
scalp emphysema and no radiopaque foreign body. The mastoid air
cells are unopacified.
IMPRESSION: 1. Small anterior upper frontal scalp contusion with minimal scalp
emphysema and no radiopaque foreign body. No evidence of calvarial
fracture.
2.  No evidence of acute intracranial abnormality.
3. Generalized cerebral volume loss and mild chronic small vessel
ischemic changes in the cerebral white matter.

## 2022-06-14 ENCOUNTER — Other Ambulatory Visit: Payer: Self-pay

## 2022-06-14 ENCOUNTER — Encounter (HOSPITAL_COMMUNITY): Payer: Self-pay

## 2022-06-14 ENCOUNTER — Observation Stay (HOSPITAL_BASED_OUTPATIENT_CLINIC_OR_DEPARTMENT_OTHER)
Admission: EM | Admit: 2022-06-14 | Discharge: 2022-06-15 | Disposition: A | Discharge status: Home / Self Care | Payer: Medicare Other | Attending: Internal Medicine | Admitting: Internal Medicine

## 2022-06-14 ENCOUNTER — Encounter (HOSPITAL_COMMUNITY): Payer: Self-pay | Admitting: Family Medicine

## 2022-06-14 ENCOUNTER — Emergency Department (HOSPITAL_BASED_OUTPATIENT_CLINIC_OR_DEPARTMENT_OTHER): Payer: Medicare Other

## 2022-06-14 DIAGNOSIS — Z87891 Personal history of nicotine dependence: Secondary | ICD-10-CM | POA: Insufficient documentation

## 2022-06-14 DIAGNOSIS — I4892 Unspecified atrial flutter: Secondary | ICD-10-CM | POA: Diagnosis not present

## 2022-06-14 DIAGNOSIS — E782 Mixed hyperlipidemia: Secondary | ICD-10-CM | POA: Diagnosis present

## 2022-06-14 DIAGNOSIS — I4891 Unspecified atrial fibrillation: Secondary | ICD-10-CM | POA: Insufficient documentation

## 2022-06-14 DIAGNOSIS — I10 Essential (primary) hypertension: Secondary | ICD-10-CM

## 2022-06-14 DIAGNOSIS — R7303 Prediabetes: Secondary | ICD-10-CM | POA: Diagnosis present

## 2022-06-14 DIAGNOSIS — Z86718 Personal history of other venous thrombosis and embolism: Secondary | ICD-10-CM | POA: Diagnosis not present

## 2022-06-14 DIAGNOSIS — E119 Type 2 diabetes mellitus without complications: Secondary | ICD-10-CM | POA: Diagnosis not present

## 2022-06-14 DIAGNOSIS — Z7984 Long term (current) use of oral hypoglycemic drugs: Secondary | ICD-10-CM | POA: Insufficient documentation

## 2022-06-14 DIAGNOSIS — Z7982 Long term (current) use of aspirin: Secondary | ICD-10-CM | POA: Diagnosis not present

## 2022-06-14 DIAGNOSIS — N401 Enlarged prostate with lower urinary tract symptoms: Secondary | ICD-10-CM | POA: Diagnosis present

## 2022-06-14 DIAGNOSIS — E871 Hypo-osmolality and hyponatremia: Secondary | ICD-10-CM | POA: Diagnosis present

## 2022-06-14 DIAGNOSIS — Z79899 Other long term (current) drug therapy: Secondary | ICD-10-CM | POA: Insufficient documentation

## 2022-06-14 DIAGNOSIS — R350 Frequency of micturition: Secondary | ICD-10-CM | POA: Diagnosis not present

## 2022-06-14 DIAGNOSIS — R000 Tachycardia, unspecified: Secondary | ICD-10-CM | POA: Diagnosis present

## 2022-06-14 LAB — BASIC METABOLIC PANEL
Anion gap: 10 (ref 5–15)
BUN: 16 mg/dL (ref 8–23)
CO2: 20 mmol/L — ABNORMAL LOW (ref 22–32)
Calcium: 8.6 mg/dL — ABNORMAL LOW (ref 8.9–10.3)
Chloride: 104 mmol/L (ref 98–111)
Creatinine, Ser: 0.86 mg/dL (ref 0.61–1.24)
GFR, Estimated: >60 mL/min (ref >60)
Glucose, Bld: 169 mg/dL — ABNORMAL HIGH (ref 70–99)
Potassium: 3.5 mmol/L (ref 3.5–5.1)
Sodium: 134 mmol/L — ABNORMAL LOW (ref 135–145)

## 2022-06-14 LAB — CBC
HCT: 44.7 % (ref 39.0–52.0)
Hemoglobin: 15.7 g/dL (ref 13.0–17.0)
MCH: 30.1 pg (ref 26.0–34.0)
MCHC: 35.1 g/dL (ref 30.0–36.0)
MCV: 85.6 fL (ref 80.0–100.0)
Platelets: 230 10*3/uL (ref 150–400)
RBC: 5.22 MIL/uL (ref 4.22–5.81)
RDW: 12.7 % (ref 11.5–15.5)
WBC: 9.4 10*3/uL (ref 4.0–10.5)
nRBC: 0 % (ref 0.0–0.2)

## 2022-06-14 LAB — HEPARIN LEVEL (UNFRACTIONATED)
Heparin Unfractionated: >1.1 IU/mL — ABNORMAL HIGH (ref 0.30–0.70)
Heparin Unfractionated: 0.44 IU/mL (ref 0.30–0.70)

## 2022-06-14 LAB — TROPONIN I (HIGH SENSITIVITY)
Troponin I (High Sensitivity): 16 ng/L (ref <18)
Troponin I (High Sensitivity): 17 ng/L (ref <18)

## 2022-06-14 LAB — MAGNESIUM: Magnesium: 2 mg/dL (ref 1.7–2.4)

## 2022-06-14 MED ORDER — ACETAMINOPHEN 325 MG PO TABS: 650.0000 mg | ORAL_TABLET | ORAL | Status: DC | PRN

## 2022-06-14 MED ORDER — INSULIN ASPART 100 UNIT/ML IJ SOLN
0.0000 [IU] | Freq: Three times a day (TID) | INTRAMUSCULAR | Status: DC
  Administered 2022-06-15: 2 [IU] via SUBCUTANEOUS
  Administered 2022-06-15: 1 [IU] via SUBCUTANEOUS

## 2022-06-14 MED ORDER — HEPARIN (PORCINE) 25000 UT/250ML-% IV SOLN
1500.0000 [IU]/h | INTRAVENOUS | Status: DC
  Administered 2022-06-14 (×2): 1500 [IU]/h via INTRAVENOUS
  Filled 2022-06-14 (×2): qty 250

## 2022-06-14 MED ORDER — HEPARIN BOLUS VIA INFUSION
5000.0000 [IU] | Freq: Once | INTRAVENOUS | Status: AC
  Administered 2022-06-14: 5000 [IU] via INTRAVENOUS

## 2022-06-14 MED ORDER — MAGNESIUM SULFATE 2 GM/50ML IV SOLN
2.0000 g | Freq: Once | INTRAVENOUS | Status: AC
  Administered 2022-06-14: 2 g via INTRAVENOUS
  Filled 2022-06-14: qty 50

## 2022-06-14 MED ORDER — ONDANSETRON HCL 4 MG/2ML IJ SOLN: 4.0000 mg | Freq: Four times a day (QID) | INTRAMUSCULAR | Status: DC | PRN

## 2022-06-14 MED ORDER — POTASSIUM CHLORIDE CRYS ER 20 MEQ PO TBCR
40.0000 meq | EXTENDED_RELEASE_TABLET | Freq: Once | ORAL | Status: AC
  Administered 2022-06-14: 40 meq via ORAL
  Filled 2022-06-14: qty 2

## 2022-06-14 MED ORDER — DILTIAZEM HCL-DEXTROSE 125-5 MG/125ML-% IV SOLN (PREMIX)
INTRAVENOUS | Status: AC
  Administered 2022-06-14: 15 mg/h via INTRAVENOUS
  Filled 2022-06-14: qty 125

## 2022-06-14 MED ORDER — ATENOLOL 25 MG PO TABS: 25.0000 mg | ORAL_TABLET | Freq: Two times a day (BID) | ORAL | Status: DC

## 2022-06-14 MED ORDER — ADENOSINE 6 MG/2ML IV SOLN
INTRAVENOUS | Status: AC
  Administered 2022-06-14: 6 mg
  Filled 2022-06-14: qty 6

## 2022-06-14 MED ORDER — DILTIAZEM LOAD VIA INFUSION
20.0000 mg | Freq: Once | INTRAVENOUS | Status: AC
  Administered 2022-06-14: 20 mg via INTRAVENOUS
  Filled 2022-06-14: qty 20

## 2022-06-14 MED ORDER — DILTIAZEM HCL-DEXTROSE 125-5 MG/125ML-% IV SOLN (PREMIX)
5.0000 mg/h | INTRAVENOUS | Status: DC
  Administered 2022-06-14: 15 mg/h via INTRAVENOUS
  Administered 2022-06-14: 5 mg/h via INTRAVENOUS
  Administered 2022-06-15: 15 mg/h via INTRAVENOUS
  Filled 2022-06-14 (×4): qty 125

## 2022-06-14 NOTE — ED Notes (Signed)
ED TO INPATIENT HANDOFF REPORT  ED Nurse Name and Phone #: Leo [facility] Paramedic [phone]  S Name/Age/Gender Peter Peters 72 y.o. male Room/Bed: MH04/MH04  Code Status   Code Status: Not on file  Home/SNF/Other Home Patient oriented to: self, place, time, and situation Is this baseline? Yes   Triage Complete: Triage complete  Chief Complaint Atrial flutter with rapid ventricular response ([place]) [I48.92]  Triage Note Pt here after his iwatch told him that his HR is 130 bpm x30 min. Pt reports he has had some L shoulder pain and L arm pain for a few days, and shob w/ exertion and fatigue. Pt reports hx of afib and had an ablation.    Allergies No Known Allergies  Level of Care/Admitting Diagnosis ED Disposition     ED Disposition  Admit   Condition  --   Comment  Hospital Area: [facility] [[id number]]  Level of Care: Progressive [102]  Admit to Progressive based on following criteria: CARDIOVASCULAR & THORACIC of moderate stability with acute coronary syndrome symptoms/low risk myocardial infarction/hypertensive urgency/arrhythmias/heart failure potentially compromising stability and stable post cardiovascular intervention patients.  Interfacility transfer: Yes  May place patient in observation at [facility] or [facility] if equivalent level of care is available:: Yes  Covid Evaluation: Asymptomatic - no recent exposure (last 10 days) testing not required  Diagnosis: Atrial flutter with rapid ventricular response [facility]) [[id number]]  Admitting Physician: Vianne Bulls [[id number]]  Attending Physician: Vianne Bulls [[id number]]          B Medical/Surgery History Past Medical History:  Diagnosis Date   Atrial fibrillation ([place])    DVT (deep venous thrombosis) ([place])    Hernia of abdominal cavity    Hypertension    Kidney stone    Past Surgical History:  Procedure Laterality Date   HERNIA REPAIR     KIDNEY STONE SURGERY       A IV  Location/Drains/Wounds Patient Lines/Drains/Airways Status     Active Line/Drains/Airways     Name Placement date Placement time Site Days   Peripheral IV 06/14/22 20 G Right Antecubital 06/14/22  0226  Antecubital  less than 1   Peripheral IV 06/14/22 20 G Posterior;Right Hand 06/14/22  0816  Hand  less than 1            Intake/Output Last 24 hours  Intake/Output Summary (Last 24 hours) at 06/14/2022 1612 Last data filed at 06/14/2022 1114 Gross per 24 hour  Intake 42.61 ml  Output 700 ml  Net -657.39 ml    Labs/Imaging Results for orders placed or performed during the hospital encounter of 06/14/22 (from the past 48 hour(s))  Basic metabolic panel     Status: Abnormal   Collection Time: 06/14/22 12:38 AM  Result Value Ref Range   Sodium 134 (L) 135 - 145 mmol/L   Potassium 3.5 3.5 - 5.1 mmol/L   Chloride 104 98 - 111 mmol/L   CO2 20 (L) 22 - 32 mmol/L   Glucose, Bld 169 (H) 70 - 99 mg/dL    Comment: Glucose reference range applies only to samples taken after fasting for at least 8 hours.   BUN 16 8 - 23 mg/dL   Creatinine, Ser 0.86 0.61 - 1.24 mg/dL   Calcium 8.6 (L) 8.9 - 10.3 mg/dL   GFR, Estimated >60 >60 mL/min    Comment: (NOTE) Calculated using the CKD-EPI Creatinine Equation (2021)    Anion gap 10 5 - 15  Comment: Performed at [facility], [address] [place], [place] [address]  CBC     Status: None   Collection Time: 06/14/22 12:38 AM  Result Value Ref Range   WBC 9.4 4.0 - 10.5 K/uL   RBC 5.22 4.22 - 5.81 MIL/uL   Hemoglobin 15.7 13.0 - 17.0 g/dL   HCT 44.7 39.0 - 52.0 %   MCV 85.6 80.0 - 100.0 fL   MCH 30.1 26.0 - 34.0 pg   MCHC 35.1 30.0 - 36.0 g/dL   RDW 12.7 11.5 - 15.5 %   Platelets 230 150 - 400 K/uL   nRBC 0.0 0.0 - 0.2 %    Comment: Performed at [facility], [address] [place], [place] [address]  Troponin I (High Sensitivity)     Status: None   Collection Time: 06/14/22 12:38 AM  Result Value Ref Range    Troponin I (High Sensitivity) 16 <18 ng/L    Comment: (NOTE) Elevated high sensitivity troponin I (hsTnI) values and significant  changes across serial measurements may suggest ACS but many other  chronic and acute conditions are known to elevate hsTnI results.  Refer to the "Links" section for chest pain algorithms and additional  guidance. Performed at [facility], [address] [place], [place] [address]   Troponin I (High Sensitivity)     Status: None   Collection Time: 06/14/22  2:26 AM  Result Value Ref Range   Troponin I (High Sensitivity) 17 <18 ng/L    Comment: (NOTE) Elevated high sensitivity troponin I (hsTnI) values and significant  changes across serial measurements may suggest ACS but many other  chronic and acute conditions are known to elevate hsTnI results.  Refer to the "Links" section for chest pain algorithms and additional  guidance. Performed at [facility], [address] [place], [place] [address]   Magnesium     Status: None   Collection Time: 06/14/22  2:26 AM  Result Value Ref Range   Magnesium 2.0 1.7 - 2.4 mg/dL    Comment: Performed at [facility], [address] [place], [place] [address]   DG Chest 2 View  Result Date: 06/14/2022 CLINICAL DATA:  chest pain EXAM: CHEST - 2 VIEW COMPARISON:  Chest x-ray 11/17/2020, CT chest 10 8 in 19 FINDINGS: The heart and mediastinal contours are within normal limits. No focal consolidation. No pulmonary edema. No pleural effusion. No pneumothorax. No acute osseous abnormality. IMPRESSION: No active cardiopulmonary disease. Electronically Signed   By: Iven Finn M.D.   On: 06/14/2022 01:07    Pending Labs Unresulted Labs (From admission, onward)     Start     Ordered   06/15/22 0500  Heparin level (unfractionated)  Daily,   R      06/14/22 0739   06/15/22 0500  CBC  Daily,   R      06/14/22 0739   06/14/22 1600  Heparin level (unfractionated)  Once-Timed,   URGENT         06/14/22 0739            Vitals/Pain Today's Vitals   06/14/22 1155 06/14/22 1342 06/14/22 1430 06/14/22 1455  BP:   (!) 157/103 (!) 157/103  Pulse:   96 (!) 145  Resp:   20 19  Temp:  98.4 F (36.9 C)    TempSrc:  Oral    SpO2:   96% 100%  Weight:  Height:      PainSc: 0-No pain       Isolation Precautions No active isolations  Medications Medications  diltiazem (CARDIZEM) 1 mg/mL load via infusion 20 mg (20 mg Intravenous Bolus from Bag 06/14/22 0239)    And  diltiazem (CARDIZEM) 125 mg in dextrose 5% 125 mL (1 mg/mL) infusion (15 mg/hr Intravenous New Bag/Given 06/14/22 0957)  heparin ADULT infusion 100 units/mL (25000 units/247mL) (1,500 Units/hr Intravenous New Bag/Given 06/14/22 0828)  adenosine (ADENOCARD) 6 MG/2ML injection (6 mg  Given 06/14/22 0235)  heparin bolus via infusion 5,000 Units (5,000 Units Intravenous Bolus from Bag 06/14/22 0829)  magnesium sulfate IVPB 2 g 50 mL (0 g Intravenous Stopped 06/14/22 1114)  potassium chloride SA (KLOR-CON M) CR tablet 40 mEq (40 mEq Oral Given 06/14/22 1018)    Mobility walks     Focused Assessments Cardiac Assessment Handoff:  Cardiac Rhythm: Atrial fibrillation (W/ RVR) No results found for: "CKTOTAL", "CKMB", "CKMBINDEX", "TROPONINI" No results found for: "DDIMER" Does the Patient currently have chest pain? No    R Recommendations: See Admitting Provider Note  Report given to:   Additional Notes:

## 2022-06-14 NOTE — ED Notes (Signed)
Pt reports apple watch alarmed him his HR was elevated PTA HR has remained in the 130s while waiting in lobby ST on monitor, pt denies any CP or SHOB only c/o fatigue

## 2022-06-14 NOTE — ED Notes (Signed)
Patient given food and Ginger Ale.

## 2022-06-14 NOTE — ED Notes (Signed)
Adenosine given to pt rapidly followed by 43ml NS flush Pt went into a atrial flutter 5:1 and then immediately return to atrial fib rate 135.   Pt tolerated well and started on Cardizem gtt

## 2022-06-14 NOTE — Progress Notes (Signed)
Plan of Care Note for accepted transfer   Patient: Peter Peters MRN: [id number]   DOA: 06/14/2022  [facility] EDP requested reevaluation for bed status.  The patient was switched to a PCU bed since his rate is much better, diltiazem and heparin infusions can be managed in the progressive unit.  I have ordered KCl 40 mEq p.o. x 1 and magnesium sulfate 2 g IVPB for electrolyte optimization.  Tennis Must, MD.

## 2022-06-14 NOTE — ED Triage Notes (Signed)
Pt here after his iwatch told him that his HR is 130 bpm x30 min. Pt reports he has had some L shoulder pain and L arm pain for a few days, and shob w/ exertion and fatigue. Pt reports hx of afib and had an ablation.

## 2022-06-14 NOTE — ED Notes (Signed)
[facility] transferring patient to [facility]

## 2022-06-14 NOTE — Progress Notes (Signed)
ANTICOAGULATION CONSULT NOTE - Initial Consult  Pharmacy Consult for Heparin infusion Indication: atrial fibrillation  No Known Allergies  Patient Measurements: Height: 6' (182.9 cm) Weight: 104.3 kg (230 lb) IBW/kg (Calculated) : 77.6 Heparin Dosing Weight: 99.2 kg  Vital Signs: Temp: 97.6 F (36.4 C) (01/16 1840) Temp Source: Oral (01/16 1840) BP: 157/92 (01/16 1840) Pulse Rate: 105 (01/16 1840)  Labs: Recent Labs    06/14/22 0038 06/14/22 0226 06/14/22 1558 06/14/22 2014  HGB 15.7  --   --   --   HCT 44.7  --   --   --   PLT 230  --   --   --   HEPARINUNFRC  --   --  >1.10* 0.44  CREATININE 0.86  --   --   --   TROPONINIHS 16 17  --   --      Estimated Creatinine Clearance: 101.2 mL/min (by C-G formula based on SCr of 0.86 mg/dL).   Medical History: Past Medical History:  Diagnosis Date   Atrial fibrillation ([place])    DVT (deep venous thrombosis) (H[facility])    Hernia of abdominal cavity    Hypertension    Kidney stone     Medications:  Scheduled:   atenolol  25 mg Oral BID   Infusions:   diltiazem (CARDIZEM) infusion 15 mg/hr (06/14/22 1939)   heparin 1,500 Units/hr (06/14/22 6213)    Assessment: 72 yo M presented to ED with SVT, given adenosine and found  to be in [place]. Pt was initiated on diltiazem infusion and now is in Afib w/ RVR. Pharmacy consulted to dose and manage heparin infusion for Afib. Pt was not taking any anticoagulation prior to admission. Of note, pt has a history of Afib w/p ablation and h/o DVT.   This evening, 06/14/22 20:14 Heparin level 0.44, therapeutic, with heparin infusing at 1500 units/hr  No bleeding noted  Goal of Therapy:  Heparin level 0.3-0.7 units/ml Monitor platelets by anticoagulation protocol: Yes   Plan:  Continue IV heparin at 1500 units/hr Confirmatory 6 hour heparin level Monitor daily CBC, heparin level, and for s/sx of bleeding.   Thank you for allowing pharmacy to be a part of this patient's  care.  Royetta Asal, PharmD, [place] Please utilize Amion for appropriate phone number to reach the unit pharmacist ([place]) 06/14/2022 9:10 PM   Please refer to [facility] for pharmacy phone number

## 2022-06-14 NOTE — ED Provider Notes (Addendum)
[place] DEPT [facility] Provider Note: Georgena Spurling, MD, FACEP  CSN: [id number] MRN: [id number] ARRIVAL: 06/14/22 at [place]: [place]  Tachycardia   HISTORY OF PRESENT ILLNESS  06/14/22 2:29 AM Peter Peters is a 72 y.o. male with a history of atrial fibrillation status post ablation, and deep vein thrombosis, not currently on anticoagulation.  He is here with a rapid heart rate that came on suddenly while he was watching TV about 11 PM.  It was his Apple Watch that informed him.  His heart rate has been in the 130s.  He denies any chest pain or shortness of breath with this.    Past Medical History:  Diagnosis Date   Atrial fibrillation ([place])    DVT (deep venous thrombosis) (HCC)    Hernia of abdominal cavity    Hypertension    Kidney stone     Past Surgical History:  Procedure Laterality Date   HERNIA REPAIR     KIDNEY STONE SURGERY      No family history on file.  Social History   Tobacco Use   Smoking status: Former   Smokeless tobacco: Never  Substance Use Topics   Alcohol use: Yes   Drug use: No    Prior to Admission medications   Medication Sig Start Date End Date Taking? Authorizing Provider  aspirin 81 MG tablet Take 164 mg by mouth daily.    [provider]  atenolol (TENORMIN) 25 MG tablet Take by mouth. 10/03/16   [provider]  atorvastatin (LIPITOR) 20 MG tablet Take by mouth. 11/27/17   [provider]  ciprofloxacin (CIPRO) 500 MG tablet Take 1 tablet (500 mg total) by mouth 2 (two) times daily. One po bid x 7 days 03/31/14   Veryl Speak, MD  HYDROcodone-acetaminophen (NORCO) 5-325 MG per tablet Take 1-2 tablets by mouth every 6 (six) hours as needed. 03/31/14   Veryl Speak, MD  metroNIDAZOLE (FLAGYL) 500 MG tablet Take 1 tablet (500 mg total) by mouth 3 (three) times daily. One po bid x 7 days 03/31/14   Veryl Speak, MD  Potassium Citrate 15 MEQ (1620 MG) TBCR Take by mouth. 05/09/17    [provider]    Allergies Patient has no known allergies.   REVIEW OF SYSTEMS  Negative except as noted here or in the History of Present Illness.   PHYSICAL EXAMINATION  Initial Vital Signs Blood pressure (!) 134/100, pulse (!) 136, temperature 97.8 F (36.6 C), resp. rate 17, height 6' (1.829 m), weight 104.3 kg, SpO2 90 %.  Examination General: Well-developed, well-nourished male in no acute distress; appearance consistent with age of record HENT: normocephalic; atraumatic Eyes: Normal appearance Neck: supple Heart: regular rate and rhythm; tachycardia Lungs: clear to auscultation bilaterally Abdomen: soft; nondistended; nontender; bowel sounds present Extremities: No deformity; full range of motion; pulses normal Neurologic: Awake, alert and oriented; motor function intact in all extremities and symmetric; no facial droop Skin: Warm and dry Psychiatric: Normal mood and affect   RESULTS  Summary of this visit's results, reviewed and interpreted by myself:   EKG Interpretation  Date/Time:    Ventricular Rate:    PR Interval:    QRS Duration:   QT Interval:    QTC Calculation:   R Axis:     Text Interpretation:         Laboratory Studies: Results for orders placed or performed during the hospital encounter of 06/14/22 (from the past 24 hour(s))  Basic metabolic panel     Status: Abnormal   Collection Time: 06/14/22 12:38 AM  Result Value Ref Range   Sodium 134 (L) 135 - 145 mmol/L   Potassium 3.5 3.5 - 5.1 mmol/L   Chloride 104 98 - 111 mmol/L   CO2 20 (L) 22 - 32 mmol/L   Glucose, Bld 169 (H) 70 - 99 mg/dL   BUN 16 8 - 23 mg/dL   Creatinine, Ser 0.86 0.61 - 1.24 mg/dL   Calcium 8.6 (L) 8.9 - 10.3 mg/dL   GFR, Estimated >60 >60 mL/min   Anion gap 10 5 - 15  CBC     Status: None   Collection Time: 06/14/22 12:38 AM  Result Value Ref Range   WBC 9.4 4.0 - 10.5 K/uL   RBC 5.22 4.22 - 5.81 MIL/uL   Hemoglobin 15.7 13.0 - 17.0 g/dL   HCT  44.7 39.0 - 52.0 %   MCV 85.6 80.0 - 100.0 fL   MCH 30.1 26.0 - 34.0 pg   MCHC 35.1 30.0 - 36.0 g/dL   RDW 12.7 11.5 - 15.5 %   Platelets 230 150 - 400 K/uL   nRBC 0.0 0.0 - 0.2 %  Troponin I (High Sensitivity)     Status: None   Collection Time: 06/14/22 12:38 AM  Result Value Ref Range   Troponin I (High Sensitivity) 16 <18 ng/L  Troponin I (High Sensitivity)     Status: None   Collection Time: 06/14/22  2:26 AM  Result Value Ref Range   Troponin I (High Sensitivity) 17 <18 ng/L   Imaging Studies: DG Chest 2 View  Result Date: 06/14/2022 CLINICAL DATA:  chest pain EXAM: CHEST - 2 VIEW COMPARISON:  Chest x-ray 11/17/2020, CT chest 10 8 in 19 FINDINGS: The heart and mediastinal contours are within normal limits. No focal consolidation. No pulmonary edema. No pleural effusion. No pneumothorax. No acute osseous abnormality. IMPRESSION: No active cardiopulmonary disease. Electronically Signed   By: Iven Finn M.D.   On: 06/14/2022 01:07    ED COURSE and MDM  Nursing notes, initial and subsequent vitals signs, including pulse oximetry, reviewed and interpreted by myself.  Vitals:   06/14/22 0413 06/14/22 0415 06/14/22 0430 06/14/22 0445  BP:  (!) 111/92 (!) 127/92 126/88  Pulse: (!) 126 (!) 137 (!) 138 (!) 126  Resp: 15 17 14 17   Temp:      SpO2: 92% 91% 92% 92%  Weight:      Height:       Medications  diltiazem (CARDIZEM) 1 mg/mL load via infusion 20 mg (20 mg Intravenous Bolus from Bag 06/14/22 0239)    And  diltiazem (CARDIZEM) 125 mg in dextrose 5% 125 mL (1 mg/mL) infusion (15 mg/hr Intravenous Rate/Dose Change 06/14/22 0337)  dilTIAZem HCl-Dextrose 125-5 MG/125ML-% infusion (has no administration in time range)  adenosine (ADENOCARD) 6 MG/2ML injection (6 mg  Given 06/14/22 0235)   2:41 AM The patient's rhythm appeared to be supraventricular tachycardia so he was given 6 mg of adenosine IV push.  This slowed his heart rate down transiently.  During this slowing it was  evident the patient was in atrial flutter.  After the adenosine wore off he returned to a tachycardic rhythm in the 130s, most likely atrial flutter with rapid ventricular response.  The patient was then started on a Cardizem bolus of 20 mg followed by a Cardizem drip which will be titrated.  4:50 AM Despite Cardizem bolus and infusion now at  15 mg/h the patient's heart rate is still poorly controlled, although it is converted to atrial fibrillation from atrial flutter.  The rate is currently at about 108-122.  5:18 AM Dr. Antionette Char accepts for admission to the hospitalist service.   PROCEDURES  Procedures CRITICAL CARE Performed by: Carlisle Beers Travis Mastel Total critical care time: 30 minutes Critical care time was exclusive of separately billable procedures and treating other patients. Critical care was necessary to treat or prevent imminent or life-threatening deterioration. Critical care was time spent personally by me on the following activities: development of treatment plan with patient and/or surrogate as well as nursing, discussions with consultants, evaluation of patient's response to treatment, examination of patient, obtaining history from patient or surrogate, ordering and performing treatments and interventions, ordering and review of laboratory studies, ordering and review of radiographic studies, pulse oximetry and re-evaluation of patient's condition.   ED DIAGNOSES     ICD-10-CM   1. Atrial fibrillation with rapid ventricular response (HCC)  I48.91     2. Atrial flutter with rapid ventricular response (HCC)  I48.92          Paula Libra, MD 06/14/22 2536    Paula Libra, MD 06/14/22 [phone]

## 2022-06-14 NOTE — Progress Notes (Signed)
Plan of Care Note for accepted transfer   Patient: Peter Peters MRN: [id number]   DOA: 06/14/2022  Facility requesting transfer: [facility]   Requesting Provider: Dr. Florina Ou   Reason for transfer: Rapid atrial fibrillation   Facility course: 72 yr old man with hx of type 2 DM and atrial fibrillation s/p ablation who came in after noting HR to be 130s on his watch. He appeared to be SVT initially in ED, rate slowed with adenosine and appeared to be atrial flutter, and he was then given 20 mg diltiazem load and was started on diltiazem infusion. He is now in rapid atrial fibrillation per ED physician.   Plan of care: The patient is accepted for admission to [facility] unit, at [facility].   Author: Vianne Bulls, MD 06/14/2022  Check [email or web address] for on-call coverage.  Nursing staff, Please call [place] number on Amion as soon as patient's arrival, so appropriate admitting provider can evaluate the pt.

## 2022-06-14 NOTE — H&P (Signed)
History and Physical    Patient: Peter Peters BPZ:[id number] DOB: [birth year] DOA: 06/14/2022 DOS: the patient was seen and examined on 06/14/2022 PCP: Jefm Petty, MD  Patient coming from: Home  Chief Complaint:  Chief Complaint  Patient presents with   Tachycardia   HPI: Peter Peters is a 72 y.o. male with medical history significant of recurrent DVT, type 2 diabetes, atrial fibrillation status post ablation hypertension, kidney stones, who presented to [facility] with palpitations.  Heart rate was in the 130s.  Initially read as SVT but later appeared to be atrial flutter.  Patient was given Cardizem 20 mg load and started on Cardizem infusion.  His he seemed to be in A-fib with RVR.  As a result patient was sent over to the hospital for further evaluation and treatment.  He has some shortness of breath.  Denied any chest pain.  Denied any other complaint.  Patient otherwise hemodynamically stable.  Review of Systems: As mentioned in the history of present illness. All other systems reviewed and are negative. Past Medical History:  Diagnosis Date   Atrial fibrillation ([place])    DVT (deep venous thrombosis) (HCC)    Hernia of abdominal cavity    Hypertension    Kidney stone    Past Surgical History:  Procedure Laterality Date   HERNIA REPAIR     KIDNEY STONE SURGERY     Social History:  reports that he has quit smoking. He has never used smokeless tobacco. He reports current alcohol use. He reports that he does not use drugs.  No Known Allergies  No family history on file.  Prior to Admission medications   Medication Sig Start Date End Date Taking? Authorizing Provider  aspirin 81 MG tablet Take 164 mg by mouth daily.    [provider]  atenolol (TENORMIN) 25 MG tablet Take by mouth. 10/03/16   [provider]  atorvastatin (LIPITOR) 20 MG tablet Take by mouth. 11/27/17   [provider]  ciprofloxacin (CIPRO) 500 MG tablet Take 1  tablet (500 mg total) by mouth 2 (two) times daily. One po bid x 7 days 03/31/14   Veryl Speak, MD  HYDROcodone-acetaminophen (NORCO) 5-325 MG per tablet Take 1-2 tablets by mouth every 6 (six) hours as needed. 03/31/14   Veryl Speak, MD  metFORMIN (GLUCOPHAGE-XR) 500 MG 24 hr tablet Take 1,000 mg by mouth 2 (two) times daily. 03/17/22   [provider]  metroNIDAZOLE (FLAGYL) 500 MG tablet Take 1 tablet (500 mg total) by mouth 3 (three) times daily. One po bid x 7 days 03/31/14   Veryl Speak, MD  OZEMPIC, 0.25 OR 0.5 MG/DOSE, 2 MG/3ML SOPN Inject 0.25 mg into the skin once a week. 06/02/22   [provider]  Potassium Citrate 15 MEQ (1620 MG) TBCR Take by mouth. 05/09/17   [provider]    Physical Exam: Vitals:   06/14/22 1430 06/14/22 1455 06/14/22 1750 06/14/22 1840  BP: (!) 157/103 (!) 157/103 (!) 155/101 (!) 157/92  Pulse: 96 (!) 145 (!) 116 (!) 105  Resp: 20 19 19 20   Temp:   98.5 F (36.9 C) 97.6 F (36.4 C)  TempSrc:   Oral Oral  SpO2: 96% 100% 100% 96%  Weight:      Height:    6' (1.829 m)   Constitutional: NAD, calm, comfortable Eyes: PERRL, lids and conjunctivae normal ENMT: Mucous membranes are moist. Posterior pharynx clear of any exudate or lesions.Normal dentition.  Neck:  normal, supple, no masses, no thyromegaly Respiratory: clear to auscultation bilaterally, no wheezing, no crackles. Normal respiratory effort. No accessory muscle use.  Cardiovascular: Irregularly irregular rate and rhythm, no murmurs / rubs / gallops. No extremity edema. 2+ pedal pulses. No carotid bruits.  Abdomen: no tenderness, no masses palpated. No hepatosplenomegaly. Bowel sounds positive.  Musculoskeletal: Good range of motion, no joint swelling or tenderness, Skin: no rashes, lesions, ulcers. No induration Neurologic: CN 2-12 grossly intact. Sensation intact, DTR normal. Strength 5/5 in all 4.  Psychiatric: Normal judgment and insight. Alert and oriented x 3.  Normal mood  Data Reviewed:  Sodium 134 potassium 3.5 chloride 104.  CO2 20 glucose 169 calcium 8.6.  CBC on entirely within normal.  Glucose is 169.  Head CT without contrast showed small anterior upper frontal scalp contusion but nothing intracranial chest x-ray showed no acute findings EKG showed A-fib with RVR.  Assessment and Plan:   #1 atrial fibrillation with rapid ventricular response: Patient has history of atrial fibrillation status post ablation.  Presenting now with A-fib with RVR.  Patient will be admitted and continue on Cardizem drip.  Also heparin drip for anticoagulation.  Echocardiogram will be done.  Cardiology consultation in the morning.  At this point he appears to be hemodynamically stable.  Will therefore continue with management.  #2 type 2 diabetes: Sliding scale insulin.  Patient was prediabetic.  Check A1c  #3 history of BPH: Not on treatment at the moment.  Monitor  #4 essential hypertension: Blood pressure elevated.  Resume home regimen  #5 hyponatremia: Probably dehydration.  Hydrate and monitor  #6 hyperlipidemia: Not on any treatment at the moment.  Will check fasting lipid panel     Advance Care Planning:   Code Status: Full Code   Consults: Cardiology consult in the morning  Family Communication: No family at bedside  Severity of Illness: The appropriate patient status for this patient is INPATIENT. Inpatient status is judged to be reasonable and necessary in order to provide the required intensity of service to ensure the patient's safety. The patient's presenting symptoms, physical exam findings, and initial radiographic and laboratory data in the context of their chronic comorbidities is felt to place them at high risk for further clinical deterioration. Furthermore, it is not anticipated that the patient will be medically stable for discharge from the hospital within 2 midnights of admission.   * I certify that at the point of admission it is my  clinical judgment that the patient will require inpatient hospital care spanning beyond 2 midnights from the point of admission due to high intensity of service, high risk for further deterioration and high frequency of surveillance required.*  AuthorBarbette Merino, MD 06/14/2022 6:55 PM  For on call review [email or web address].

## 2022-06-14 NOTE — Progress Notes (Signed)
ANTICOAGULATION CONSULT NOTE - Initial Consult  Pharmacy Consult for Heparin infusion Indication: atrial fibrillation  No Known Allergies  Patient Measurements: Height: 6' (182.9 cm) Weight: 104.3 kg (230 lb) IBW/kg (Calculated) : 77.6 Heparin Dosing Weight: 99.2 kg  Vital Signs: Temp: 97.8 F (36.6 C) (01/16 0514) Temp Source: Oral (01/16 0514) BP: 130/85 (01/16 0700) Pulse Rate: 111 (01/16 0700)  Labs: Recent Labs    06/14/22 0038 06/14/22 0226  HGB 15.7  --   HCT 44.7  --   PLT 230  --   CREATININE 0.86  --   TROPONINIHS 16 17    Estimated Creatinine Clearance: 101.2 mL/min (by C-G formula based on SCr of 0.86 mg/dL).   Medical History: Past Medical History:  Diagnosis Date   Atrial fibrillation ([place])    DVT (deep venous thrombosis) (HCC)    Hernia of abdominal cavity    Hypertension    Kidney stone     Medications:  Scheduled:   dilTIAZem HCl-Dextrose       Infusions:   diltiazem (CARDIZEM) infusion 15 mg/hr (06/14/22 1937)    Assessment: 72 yo M presented to ED with SVT, given adenosine and found  to be in [place]. Pt was initiated on diltiazem infusion and now is in Afib w/ RVR. Pharmacy consulted to dose and manage heparin infusion for Afib. Pt was not taking any anticoagulation prior to admission. Of note, pt has a history of Afib w/p ablation and h/o DVT.   Goal of Therapy:  Heparin level 0.3-0.7 units/ml Monitor platelets by anticoagulation protocol: Yes   Plan:  Give heparin 5000 units IV bolus x1, then Initiate heparin infusion at 1500 units/hr Check heparin level in 8 hours Monitor daily CBC, heparin level, and for s/sx of bleeding.   Luisa Hart, PharmD, BCPS Clinical Pharmacist 06/14/2022 7:37 AM   Please refer to AMION for pharmacy phone number

## 2022-06-15 ENCOUNTER — Other Ambulatory Visit (HOSPITAL_COMMUNITY): Payer: Self-pay

## 2022-06-15 ENCOUNTER — Observation Stay (HOSPITAL_BASED_OUTPATIENT_CLINIC_OR_DEPARTMENT_OTHER): Payer: Medicare Other

## 2022-06-15 DIAGNOSIS — E782 Mixed hyperlipidemia: Secondary | ICD-10-CM | POA: Diagnosis not present

## 2022-06-15 DIAGNOSIS — I4892 Unspecified atrial flutter: Secondary | ICD-10-CM | POA: Diagnosis not present

## 2022-06-15 DIAGNOSIS — R7303 Prediabetes: Secondary | ICD-10-CM

## 2022-06-15 DIAGNOSIS — N401 Enlarged prostate with lower urinary tract symptoms: Secondary | ICD-10-CM | POA: Diagnosis not present

## 2022-06-15 DIAGNOSIS — R9431 Abnormal electrocardiogram [ECG] [EKG]: Secondary | ICD-10-CM

## 2022-06-15 DIAGNOSIS — I10 Essential (primary) hypertension: Secondary | ICD-10-CM

## 2022-06-15 DIAGNOSIS — R350 Frequency of micturition: Secondary | ICD-10-CM

## 2022-06-15 DIAGNOSIS — E871 Hypo-osmolality and hyponatremia: Secondary | ICD-10-CM

## 2022-06-15 LAB — ECHOCARDIOGRAM COMPLETE
Area-P 1/2: 4.49 cm2
Calc EF: 59.7 %
Height: 72 in
S' Lateral: 3.2 cm
Single Plane A2C EF: 59.9 %
Single Plane A4C EF: 58.4 %
Weight: 3680 [oz_av]

## 2022-06-15 LAB — BASIC METABOLIC PANEL WITH GFR
Anion gap: 6 (ref 5–15)
BUN: 15 mg/dL (ref 8–23)
CO2: 23 mmol/L (ref 22–32)
Calcium: 8.7 mg/dL — ABNORMAL LOW (ref 8.9–10.3)
Chloride: 105 mmol/L (ref 98–111)
Creatinine, Ser: 0.94 mg/dL (ref 0.61–1.24)
GFR, Estimated: >60 mL/min
Glucose, Bld: 157 mg/dL — ABNORMAL HIGH (ref 70–99)
Potassium: 4.3 mmol/L (ref 3.5–5.1)
Sodium: 134 mmol/L — ABNORMAL LOW (ref 135–145)

## 2022-06-15 LAB — CBC
HCT: 47.5 % (ref 39.0–52.0)
Hemoglobin: 16.4 g/dL (ref 13.0–17.0)
MCH: 29.9 pg (ref 26.0–34.0)
MCHC: 34.5 g/dL (ref 30.0–36.0)
MCV: 86.7 fL (ref 80.0–100.0)
Platelets: 239 10*3/uL (ref 150–400)
RBC: 5.48 MIL/uL (ref 4.22–5.81)
RDW: 13 % (ref 11.5–15.5)
WBC: 9.7 10*3/uL (ref 4.0–10.5)
nRBC: 0 % (ref 0.0–0.2)

## 2022-06-15 LAB — GLUCOSE, CAPILLARY
Glucose-Capillary: 154 mg/dL — ABNORMAL HIGH (ref 70–99)
Glucose-Capillary: 146 mg/dL — ABNORMAL HIGH (ref 70–99)
Glucose-Capillary: 117 mg/dL — ABNORMAL HIGH (ref 70–99)

## 2022-06-15 LAB — HEPARIN LEVEL (UNFRACTIONATED): Heparin Unfractionated: 0.51 [IU]/mL (ref 0.30–0.70)

## 2022-06-15 LAB — HEMOGLOBIN A1C
Hgb A1c MFr Bld: 6.5 % — ABNORMAL HIGH (ref 4.8–5.6)
Mean Plasma Glucose: 139.85 mg/dL

## 2022-06-15 LAB — TSH: TSH: 0.632 u[IU]/mL (ref 0.350–4.500)

## 2022-06-15 LAB — HIV ANTIBODY (ROUTINE TESTING W REFLEX): HIV Screen 4th Generation wRfx: NONREACTIVE

## 2022-06-15 MED ORDER — ATENOLOL 25 MG PO TABS: 25.0000 mg | ORAL_TABLET | Freq: Every day | ORAL | Status: DC

## 2022-06-15 MED ORDER — DILTIAZEM HCL ER COATED BEADS 120 MG PO CP24
120.0000 mg | ORAL_CAPSULE | Freq: Every day | ORAL | Status: DC
  Administered 2022-06-15: 120 mg via ORAL
  Filled 2022-06-15: qty 1

## 2022-06-15 MED ORDER — PERFLUTREN LIPID MICROSPHERE
1.0000 mL | INTRAVENOUS | Status: AC | PRN
  Administered 2022-06-15: 3 mL via INTRAVENOUS

## 2022-06-15 MED ORDER — GUAIFENESIN ER 600 MG PO TB12: 600.0000 mg | ORAL_TABLET | Freq: Two times a day (BID) | ORAL | Status: DC | PRN

## 2022-06-15 MED ORDER — DILTIAZEM HCL ER COATED BEADS 120 MG PO CP24: 120.0000 mg | ORAL_CAPSULE | Freq: Every day | ORAL | 1 refills | Status: AC

## 2022-06-15 MED ORDER — APIXABAN 5 MG PO TABS: 5.0000 mg | ORAL_TABLET | Freq: Two times a day (BID) | ORAL | 2 refills | Status: AC

## 2022-06-15 MED ORDER — ATORVASTATIN CALCIUM 20 MG PO TABS
20.0000 mg | ORAL_TABLET | Freq: Every day | ORAL | Status: DC
  Administered 2022-06-15: 20 mg via ORAL
  Filled 2022-06-15: qty 1

## 2022-06-15 MED ORDER — ATENOLOL 25 MG PO TABS
25.0000 mg | ORAL_TABLET | ORAL | Status: AC
  Administered 2022-06-15: 25 mg via ORAL
  Filled 2022-06-15: qty 1

## 2022-06-15 MED ORDER — ACETAMINOPHEN 325 MG PO TABS: 650.0000 mg | ORAL_TABLET | ORAL | Status: AC | PRN

## 2022-06-15 MED ORDER — APIXABAN 5 MG PO TABS
5.0000 mg | ORAL_TABLET | Freq: Two times a day (BID) | ORAL | Status: DC
  Administered 2022-06-15: 5 mg via ORAL
  Filled 2022-06-15: qty 1

## 2022-06-15 MED ORDER — DILTIAZEM HCL 30 MG PO TABS
30.0000 mg | ORAL_TABLET | Freq: Four times a day (QID) | ORAL | Status: DC
  Administered 2022-06-15: 30 mg via ORAL
  Filled 2022-06-15: qty 1

## 2022-06-15 NOTE — Progress Notes (Signed)
Mobility Specialist - Progress Note   06/15/22 1109  Mobility  Activity Ambulated with assistance in hallway  Level of Assistance Modified independent, requires aide device or extra time  Assistive Device Other (Comment) (IV Pole)  Distance Ambulated (ft) 480 ft  Activity Response Tolerated well  Mobility Referral Yes  $Mobility charge 1 Mobility   Pt received in bed and agreeable to mobility. No complaints during session. Pt to bathroom after session with all needs met.   [facility]

## 2022-06-15 NOTE — Consult Note (Addendum)
Cardiology Consultation   Patient ID: TREYLON HENARD MRN: [id number]; DOB: [birth year]  Admit date: 06/14/2022 Date of Consult: 06/15/2022  PCP:  Loyal Jacobson, MD    HeartCare Providers Cardiologist:  None        Patient Profile:   Peter Peters is a 72 y.o. male with a hx of  recurrent DVT, type 2 diabetes, atrial fibrillation status post ablation in 2016, hypertension, nephrolithiasis who is being seen 06/15/2022 for the evaluation of atrial fibrillation at the request of Dr. Mikeal Hawthorne.  History of Present Illness:   Peter Peters reports that he was notified of elevated heart rate by his Apple Watch on the evening of Monday 1/15.  Patient says that throughout the day on Monday he felt unusually tired with a little bit of shortness of breath.  However it was not until his watch alerted him to irregular and elevated heart rate that he realized that he was in atrial fibrillation.  Given elevated heart rate and his history of A-fib, patient presented to [facility] ED.  Per [facility] ED provider's notes, patient's initial rhythm appeared to be supraventricular tachycardia and patient received 6 mg of adenosine.  This resulted in a decrease in patient's heart rate which revealed underlying atrial flutter.  Patient was then placed on a Cardizem infusion.  Patient denies recent illness, fever, chills, GI symptoms.  He denies changes to exertional tolerance and has not experienced orthopnea or lower extremity edema.  Patient recently started Ozempic and metformin for diabetes.  He received his first Ozempic injection on 1/5 and his second on 1/12.   Patient says that since his ablation in 2016 with Dr. Chales Abrahams at [place], he has not had significant palpitations, exertional limitations, elevated heart rate, or other evidence of recurrent A-fib.  He was continued on anticoagulation for 6 months following his ablation at which time Eliquis was discontinued by Dr. Chales Abrahams.  He takes  atenolol for blood pressure management, but otherwise is not on any medications to help manage heart rate.  Patient has been taking 81 mg of aspirin daily due to history of DVT.  Review of patient's Apple Watch data today suggests that he develops rapid heart rate into the 140s in the afternoon of 1/15.  Patient's watch was not set up to monitor A-fib burden at the time.  I have now adjusted the setting for further monitoring going forward.   Past Medical History:  Diagnosis Date   Atrial fibrillation (HCC)    DVT (deep venous thrombosis) (HCC)    Hernia of abdominal cavity    Hypertension    Kidney stone     Past Surgical History:  Procedure Laterality Date   HERNIA REPAIR     KIDNEY STONE SURGERY       Home Medications:  Prior to Admission medications   Medication Sig Start Date End Date Taking? Authorizing Provider  aspirin 81 MG tablet Take 81 mg by mouth daily.   Yes [provider]  atenolol (TENORMIN) 25 MG tablet Take 25 mg by mouth at bedtime. 10/03/16  Yes [provider]  atorvastatin (LIPITOR) 20 MG tablet Take 20 mg by mouth daily. 11/27/17  Yes [provider]  guaiFENesin (MUCINEX) 600 MG 12 hr tablet Take 600 mg by mouth 2 (two) times daily as needed for cough.   Yes [provider]  ibuprofen (ADVIL) 200 MG tablet Take 600 mg by mouth every 6 (six) hours as needed for mild pain.  Yes [provider]  metFORMIN (GLUCOPHAGE-XR) 500 MG 24 hr tablet Take 1,000 mg by mouth 2 (two) times daily. 03/17/22  Yes [provider]  OZEMPIC, 0.25 OR 0.5 MG/DOSE, 2 MG/3ML SOPN Inject 0.25 mg into the skin once a week. 06/02/22  Yes [provider]  Potassium Citrate 15 MEQ (1620 MG) TBCR Take 1 tablet by mouth daily. 05/09/17  Yes [provider]  amoxicillin-clavulanate (AUGMENTIN) 875-125 MG tablet Take 1 tablet by mouth 2 (two) times daily. Finished 06-08-22 06/01/22   [provider]    Inpatient  Medications: Scheduled Meds:  atenolol  25 mg Oral QHS   diltiazem  30 mg Oral Q6H   insulin aspart  0-9 Units Subcutaneous TID WC   Continuous Infusions:  diltiazem (CARDIZEM) infusion 5 mg/hr (06/15/22 0711)   heparin 1,500 Units/hr (06/14/22 2215)   PRN Meds: acetaminophen, ondansetron (ZOFRAN) IV  Allergies:   No Known Allergies  Social History:   Social History   Socioeconomic History   Marital status: Married    Spouse name: Not on file   Number of children: Not on file   Years of education: Not on file   Highest education level: Not on file  Occupational History   Not on file  Tobacco Use   Smoking status: Former   Smokeless tobacco: Never  Substance and Sexual Activity   Alcohol use: Yes   Drug use: No   Sexual activity: Not on file  Other Topics Concern   Not on file  Social History Narrative   Not on file   Social Determinants of Health   Financial Resource Strain: Not on file  Food Insecurity: Not on file  Transportation Needs: Not on file  Physical Activity: Not on file  Stress: Not on file  Social Connections: Not on file  Intimate Partner Violence: Not on file    Family History:   History reviewed. No pertinent family history.   ROS:  Please see the history of present illness.   All other ROS reviewed and negative.     Physical Exam/Data:   Vitals:   06/14/22 1840 06/14/22 2126 06/15/22 0238 06/15/22 0606  BP: (!) 157/92 125/87 110/68 102/67  Pulse: (!) 105 76 70 (!) 51  Resp: 20 18 18 18   Temp: 97.6 F (36.4 C) 98.1 F (36.7 C) 98.7 F (37.1 C) 98 F (36.7 C)  TempSrc: Oral Oral Oral Oral  SpO2: 96% 94% 94% 94%  Weight:      Height: 6' (1.829 m)       Intake/Output Summary (Last 24 hours) at 06/15/2022 1008 Last data filed at 06/15/2022 0900 Gross per 24 hour  Intake 937.57 ml  Output 650 ml  Net 287.57 ml      06/14/2022   12:29 AM 04/28/2018    6:11 PM 09/25/2014   12:57 AM  Last 3 Weights  Weight (lbs) 230 lb 228 lb  218 lb  Weight (kg) 104.327 kg 103.42 kg 98.884 kg     Body mass index is 31.19 kg/m.  General:  Well nourished, well developed, in no acute distress HEENT: normal Neck: no JVD Vascular: No carotid bruits; Distal pulses 2+ bilaterally Cardiac:  normal S1, S2; irregularly irregular Lungs:  clear to auscultation bilaterally, no wheezing, rhonchi or rales  Abd: soft, nontender, no hepatomegaly  Ext: no edema Musculoskeletal:  No deformities, BUE and BLE strength normal and equal Skin: warm and dry  Neuro:  CNs 2-12 intact, no focal abnormalities noted  Psych:  Normal affect   EKG:  The EKG from 1/16 was personally reviewed and demonstrates:  Atrial flutter with 2:1 block. Telemetry:  Telemetry was personally reviewed and demonstrates:  coarse atrial fibrillation with variable ventricular rates, mostly in the 50s this morning.  Relevant CV Studies:  10/29/2019 TTE  Left Ventricle Left ventricle is normal in size. Wall thickness is normal. EF: 55-60%. Wall motion is normal. Doppler parameters indicate normal diastolic function.  Right Ventricle Right ventricle appears normal. Systolic function is normal.  Left Atrium Left atrium is normal in size.  Right Atrium Normal sized right atrium.  IVC/SVC The inferior vena cava demonstrates a diameter of <=2.1 cm and collapses >50%; therefore, the right atrial pressure is estimated at 3 mmHg.  Mitral Valve Mitral valve structure is normal. The leaflets are mildly thickened. There is no mitral regurgitation.  Tricuspid Valve Tricuspid valve structure is normal. There is trace regurgitation. The right ventricular systolic pressure is normal (<36 mmHg).  Aortic Valve The aortic valve is tricuspid. The leaflets are not thickened and exhibit normal excursion. There is no regurgitation or stenosis.  Pulmonic Valve The pulmonic valve was not well visualized. Trace regurgitation.  Ascending Aorta The aortic root is normal in  size.  Pericardium There is no pericardial effusion.  Study Details A complete echo was performed using complete 2D, color flow Doppler and spectral Doppler. Overall the study quality was good.    Laboratory Data:  High Sensitivity Troponin:   Recent Labs  Lab 06/14/22 0038 06/14/22 0226  TROPONINIHS 16 17     Chemistry Recent Labs  Lab 06/14/22 0038 06/14/22 0226 06/15/22 0453  NA 134*  --  134*  K 3.5  --  4.3  CL 104  --  105  CO2 20*  --  23  GLUCOSE 169*  --  157*  BUN 16  --  15  CREATININE 0.86  --  0.94  CALCIUM 8.6*  --  8.7*  MG  --  2.0  --   GFRNONAA >60  --  >60  ANIONGAP 10  --  6    No results for input(s): "PROT", "ALBUMIN", "AST", "ALT", "ALKPHOS", "BILITOT" in the last 168 hours. Lipids No results for input(s): "CHOL", "TRIG", "HDL", "LABVLDL", "LDLCALC", "CHOLHDL" in the last 168 hours.  Hematology Recent Labs  Lab 06/14/22 0038 06/15/22 0453  WBC 9.4 9.7  RBC 5.22 5.48  HGB 15.7 16.4  HCT 44.7 47.5  MCV 85.6 86.7  MCH 30.1 29.9  MCHC 35.1 34.5  RDW 12.7 13.0  PLT 230 239   Thyroid No results for input(s): "TSH", "FREET4" in the last 168 hours.  BNPNo results for input(s): "BNP", "PROBNP" in the last 168 hours.  DDimer No results for input(s): "DDIMER" in the last 168 hours.   Radiology/Studies:  DG Chest 2 View  Result Date: 06/14/2022 CLINICAL DATA:  chest pain EXAM: CHEST - 2 VIEW COMPARISON:  Chest x-ray 11/17/2020, CT chest 10 8 in 19 FINDINGS: The heart and mediastinal contours are within normal limits. No focal consolidation. No pulmonary edema. No pleural effusion. No pneumothorax. No acute osseous abnormality. IMPRESSION: No active cardiopulmonary disease. Electronically Signed   By: Tish Frederickson M.D.   On: 06/14/2022 01:07     Assessment and Plan:   Atrial fibrillation/atrial flutter with RVR  Patient with history of atrial fibrillation in 2016, underwent successful cryoablation/PVI via ESI at [facility] with  Dr. Chales Abrahams.  Has reportedly maintained sinus rhythm since and  has not been on anticoagulation following clearance after his ablation.  Patient presented to [facility] on 1/16 after a day of weakness and fatigue and an Apple Watch alert for irregular and elevated heart rate the night prior.  Patient initially thought to have SVT and received adenosine 6 mg by ED provider at [facility]  This slowed patient's heart rate enough to reveal underlying atrial flutter for which she was given diltiazem bolus and infusion. Patient's heart rate now under much better control with diltiazem infusion.  He has also been placed on heparin.    Will transition to Eliquis 5 mg twice daily along with oral diltiazem CD today, 120mg  24. Stop daily ASA with Eliquis. Patient would need transesophageal echocardiogram prior to cardioversion given unknown duration of A-fib.  Given his established relationship with [facility] Elonda Husky at [facility], would prefer conservative management at this time as patient was without symptoms.  I have adjusted patient's Apple Watch settings to monitor atrial fibrillation burden going forward. LVEF normal as of 2021 TTE. Repeat transesophageal echocardiogram scheduled for today.  Will follow results. TSH ordered  Obstructive Sleep Apnea  Patient reports history of "mild" obstructive sleep apnea per historical sleep study.  He says that he does not wear a CPAP device at night but does intermittently use a mouthpiece designed to reduce symptoms.  Discussed that sleep apnea is a risk factor for atrial fibrillation and encouraged patient to have a repeat sleep study done.  Patient has also begun Ozempic and metformin for diabetes management and this will hopefully result in weight loss that may also reduce sleep apnea risk.  Hypertension  Stop Atenolol with diltiazem initiation.  Per primary team:  DM type II  Risk Assessment/Risk Scores:          CHA2DS2-VASc Score = 4   This indicates  a 4.8% annual risk of stroke. The patient's score is based upon: CHF History: 0 HTN History: 1 Diabetes History: 1 Stroke History: 0 Vascular Disease History: 1 Age Score: 1 Gender Score: 0         For questions or updates, please contact [place] Please consult [email or web address] for contact info under    Signed, Lily Kocher, PA-C  06/15/2022 10:08 AM

## 2022-06-15 NOTE — Progress Notes (Signed)
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  Cardiology consulted.   Assessment & Plan:   Principal Problem:   Atrial flutter with rapid ventricular response (HCC) Active Problems:   Hyponatremia   Benign prostatic hyperplasia with urinary frequency   Mixed hyperlipidemia   Pre-diabetes  #1 atrial flutter with RVR Cha2ds2vasc SCORE = 4. -Patient initially presented to ED after his Apple Watch alerted him to elevated heart rate and irregular due to history of A-fib elevated heart rate patient presented to the med [facility] ED. -Patient noted initially on presentation to have SVT patient received adenosine 6 mg and heart rate decreased revealing underlying atrial flutter.  Patient placed on Cardizem infusion and admitted. -Cardiac enzymes cycled were negative x 2.  -Chest x-ray with no acute abnormalities. -TSH pending. -2D echo pending. -Patient noted with heart rates in the 50s and as such patient started on Cardizem 30 mg every 6 hours with overlap with Cardizem drip to be discontinued 2 hours after oral Cardizem given. -Patient seen in consultation by cardiology and patient placed on Cardizem 120 mg daily as well as being started on Eliquis for anticoagulation. -Appreciate cardiology input and recommendations.  2.  Prediabetes/type 2 diabetes -Patient with hemoglobin A1c 6.5. -Continue to hold home regimen oral hypoglycemic agents of metformin and Ozempic. -SSI.  3.  History of BPH -Not on any medications. -Outpatient follow-up.  4.   Hypertension -Currently on Cardizem. -Continue to hold atenolol.  5.  Hyponatremia -Felt likely secondary to hypovolemic hyponatremia secondary to dehydration. -Stable.  6.  Hyperlipidemia -Resume home regimen statin.    DVT prophylaxis: Eliquis Code Status: Full Family Communication: Updated patient.  No family at bedside. Disposition: Home once cleared by cardiology hopefully in the next 24 hours.  Status is: Observation The patient remains OBS appropriate and will d/c before 2 midnights.   Consultants:  Cardiology: Dr. Davina Poke 06/15/2022  Procedures:  Chest x-ray 06/14/2022 2D echo pending  Antimicrobials:  None   Subjective: Laying in bed.  Denies any chest pain.  No shortness of breath.  No abdominal pain.  Stated had some left shoulder and left upper extremity pain approximately 5 days ago which has since resolved.  States was just seen by cardiology.  Objective: Vitals:   06/14/22 1840 06/14/22 2126 06/15/22 0238 06/15/22 0606  BP: (!) 157/92 125/87 110/68 102/67  Pulse: (!) 105 76 70 (!) 51  Resp: 20 18 18 18   Temp: 97.6 F (36.4 C) 98.1 F (36.7 C) 98.7 F (37.1 C) 98 F (36.7 C)  TempSrc: Oral Oral Oral Oral  SpO2: 96% 94% 94% 94%  Weight:      Height: 6' (1.829 m)       Intake/Output Summary (Last 24 hours) at 06/15/2022 1102 Last data filed at 06/15/2022 0900 Gross per 24 hour  Intake 937.57 ml  Output 650 ml  Net 287.57 ml   Filed Weights   06/14/22 0029  Weight: 104.3 kg    Examination:  General exam: Appears calm and comfortable  Respiratory system: Clear to auscultation. Respiratory effort normal. Cardiovascular system: Irregularly irregular.  No JVD, no murmurs rubs or gallops.  No lower extremity edema.  Gastrointestinal system: Abdomen is nondistended, soft and nontender. No organomegaly or masses felt. Normal bowel sounds heard. Central nervous system: Alert and oriented. No focal neurological deficits. Extremities: Symmetric 5 x  5 power. Skin: No rashes, lesions or ulcers Psychiatry: Judgement and insight appear normal. Mood & affect appropriate.     Data Reviewed: I have personally reviewed following labs and imaging studies  CBC: Recent Labs  Lab 06/14/22 0038 06/15/22 0453  WBC 9.4 9.7  HGB 15.7 16.4  HCT 44.7 47.5  MCV 85.6 86.7  PLT 230 182    Basic Metabolic Panel: Recent Labs  Lab 06/14/22 0038 06/14/22 0226 06/15/22 0453  NA 134*  --  134*  K 3.5  --  4.3  CL 104  --  105  CO2 20*  --  23  GLUCOSE 169*  --  157*  BUN 16  --  15  CREATININE 0.86  --  0.94  CALCIUM 8.6*  --  8.7*  MG  --  2.0  --     GFR: Estimated Creatinine Clearance: 92.6 mL/min (by C-G formula based on SCr of 0.94 mg/dL).  Liver Function Tests: No results for input(s): "AST", "ALT", "ALKPHOS", "BILITOT", "PROT", "ALBUMIN" in the last 168 hours.  CBG: Recent Labs  Lab 06/15/22 0810  GLUCAP 154*     No results found for this or any previous visit (from the past 240 hour(s)).       Radiology Studies: DG Chest 2 View  Result Date: 06/14/2022 CLINICAL DATA:  chest pain EXAM: CHEST - 2 VIEW COMPARISON:  Chest x-ray 11/17/2020, CT chest 10 8 in 19 FINDINGS: The heart and mediastinal contours are within normal limits. No focal consolidation. No pulmonary edema. No pleural effusion. No pneumothorax. No acute osseous abnormality. IMPRESSION: No active cardiopulmonary disease. Electronically Signed   By: Iven Finn M.D.   On: 06/14/2022 01:07        Scheduled Meds:  apixaban  5 mg Oral BID   diltiazem  120 mg Oral Daily   insulin aspart  0-9 Units Subcutaneous TID WC   Continuous Infusions:   LOS: 0 days    Time spent: 40 minutes    Irine Seal, MD [facility]   To contact the attending provider between 7A-7P or the covering provider during after hours 7P-7A, please log into the web site [email or web address] and access using universal  password for that web site. If you do  not have the password, please call the hospital operator.  06/15/2022, 11:02 AM

## 2022-06-15 NOTE — Progress Notes (Signed)
Echocardiogram 2D Echocardiogram has been performed.  Peter Peters 06/15/2022, 2:46 PM

## 2022-06-15 NOTE — TOC Benefit Eligibility Note (Signed)
Patient Teacher, English as a foreign language completed.    The patient is currently admitted and upon discharge could be taking Eliquis.  The current 30 day co-pay is $45.00.   The patient is insured through [employer]

## 2022-06-15 NOTE — Progress Notes (Signed)
ANTICOAGULATION CONSULT NOTE  Pharmacy Consult for Heparin infusion Indication: atrial fibrillation  No Known Allergies  Patient Measurements: Height: 6' (182.9 cm) Weight: 104.3 kg (230 lb) IBW/kg (Calculated) : 77.6 Heparin Dosing Weight: 99.2 kg  Vital Signs: Temp: 98.7 F (37.1 C) (01/17 0238) Temp Source: Oral (01/17 0238) BP: 110/68 (01/17 0238) Pulse Rate: 70 (01/17 0238)  Labs: Recent Labs    06/14/22 0038 06/14/22 0226 06/14/22 1558 06/14/22 2014 06/15/22 0453  HGB 15.7  --   --   --  16.4  HCT 44.7  --   --   --  47.5  PLT 230  --   --   --  239  HEPARINUNFRC  --   --  >1.10* 0.44 0.51  CREATININE 0.86  --   --   --  0.94  TROPONINIHS 16 17  --   --   --      Estimated Creatinine Clearance: 92.6 mL/min (by C-G formula based on SCr of 0.94 mg/dL).   Medical History: Past Medical History:  Diagnosis Date   Atrial fibrillation (HCC)    DVT (deep venous thrombosis) (HCC)    Hernia of abdominal cavity    Hypertension    Kidney stone     Medications:  Scheduled:   atenolol  25 mg Oral BID   insulin aspart  0-9 Units Subcutaneous TID WC   Infusions:   diltiazem (CARDIZEM) infusion 15 mg/hr (06/15/22 0250)   heparin 1,500 Units/hr (06/14/22 2215)    Assessment: 72 yo M presented to ED with SVT, given adenosine and found  to be in [place]. Pt was initiated on diltiazem infusion and now is in Afib w/ RVR. Pharmacy consulted to dose and manage heparin infusion for Afib. Pt was not taking any anticoagulation prior to admission. Of note, pt has a history of Afib w/p ablation and h/o DVT.   Today, 06/15/22 Heparin level 0.51, therapeutic x2 on heparin 1500 units/hr  No bleeding or infusion interruptions reported by RN CBC WNL  Goal of Therapy:  Heparin level 0.3-0.7 units/ml Monitor platelets by anticoagulation protocol: Yes   Plan:  Continue IV heparin at 1500 units/hr Monitor daily CBC, heparin level, and for s/sx of bleeding.   Thank you for  allowing pharmacy to be a part of this patient's care.  Netta Cedars, PharmD, BCPS 06/15/2022 5:46 AM   Please refer to AMION for pharmacy phone number

## 2022-06-15 NOTE — Discharge Summary (Signed)
Physician Discharge Summary  WADSWORTH SKOLNICK JQB:[id number] DOB: Jan 05, 1951 DOA: 06/14/2022  PCP: Jefm Petty, MD  Admit date: 06/14/2022 Discharge date: 06/15/2022  Time spent: 55 minutes  Recommendations for Outpatient Follow-up:  Follow-up with primary cardiologist, Dr. Loni Beckwith for further evaluation of atrial flutter and discussion of outpatient cardioversion. Follow-up with Jefm Petty, MD in 3 weeks.  On follow-up patient's blood pressure need to be reassessed.  Patient's prediabetes will need to be followed up upon.  Patient will need a basic metabolic profile done to follow-up on electrolytes and renal function.   Discharge Diagnoses:  Principal Problem:   Atrial flutter with rapid ventricular response (HCC) Active Problems:   Hyponatremia   Benign prostatic hyperplasia with urinary frequency   Mixed hyperlipidemia   Pre-diabetes   Hypertension   Discharge Condition: Stable and improved.  Diet recommendation: Heart healthy  Filed Weights   06/14/22 0029  Weight: 104.3 kg    History of present illness:  HPI per Dr. Mayme Genta is a 72 y.o. male with medical history significant of recurrent DVT, type 2 diabetes, atrial fibrillation status post ablation hypertension, kidney stones, who presented to [facility] with palpitations.  Heart rate was in the 130s.  Initially read as SVT but later appeared to be atrial flutter.  Patient was given Cardizem 20 mg load and started on Cardizem infusion.  His he seemed to be in A-fib with RVR.  As a result patient was sent over to the hospital for further evaluation and treatment.  He has some shortness of breath.  Denied any chest pain.  Denied any other complaint.  Patient otherwise hemodynamically stable.   Hospital Course:  #1 atrial flutter with RVR Cha2ds2vasc SCORE = 4. -Patient initially presented to ED after his Apple Watch alerted him to elevated heart rate and irregular due to history of  A-fib elevated heart rate patient presented to the med [facility] ED. -Patient noted initially on presentation to have SVT patient received adenosine 6 mg and heart rate decreased revealing underlying atrial flutter.  Patient placed on Cardizem infusion and admitted. -Cardiac enzymes cycled were negative x 2.  -Chest x-ray with no acute abnormalities. -TSH within normal limits at 0.632. -2D echo with a EF of 55 to 60%, NWMA, mild LVH, normal right ventricular systolic function, normal right ventricular size, normal mitral valve, trivial MVR, no evidence of mitral stenosis.  No evidence of aortic valvular stenosis. -Patient noted with heart rates in the 50s and as such patient started on Cardizem 30 mg every 6 hours with overlap with Cardizem drip to be discontinued 2 hours after oral Cardizem given. -Patient seen in consultation by cardiology and patient placed on Cardizem 120 mg daily as well as being started on Eliquis for anticoagulation. -Cardiology assessed the patient and discussed inpatient TEE/cardioversion versus outpatient given his rate was controlled, patient opted for outpatient evaluation and as such recommended discontinuation of aspirin and atenolol as patient has been started on Cardizem and Eliquis for anticoagulation. --Cardiology recommended close outpatient follow-up with primary cardiologist, Dr. Elonda Husky for evaluation for outpatient cardioversion.   2.  Prediabetes/type 2 diabetes -Patient with hemoglobin A1c 6.5. -Patient's home regimen oral hypoglycemic agents of metformin and Ozempic were held during the hospitalization and patient maintained on sliding scale insulin. -Home regimen will be resumed on discharge.   3.  History of BPH -Not on any medications. -Outpatient follow-up.   4.  Hypertension -Patient's atenolol was held during  the hospitalization will be discontinued on discharge.   -Patient was on Cardizem drip and subsequently transition to  oral Cardizem secondary to atrial flutter.   -Outpatient follow-up with PCP.     5.  Hyponatremia -Felt likely secondary to hypovolemic hyponatremia secondary to dehydration. -Stable.   6.  Hyperlipidemia -Home regimen statin statin resumed.    Procedures: Chest x-ray 06/14/2022 2D echo 06/15/2022  Consultations: Cardiology: Dr. Flora Lipps 06/15/2022  Discharge Exam: Vitals:   06/15/22 0606 06/15/22 1236  BP: 102/67 132/82  Pulse: (!) 51 70  Resp: 18 20  Temp: 98 F (36.7 C) 97.6 F (36.4 C)  SpO2: 94% 95%    General: NAD. Cardiovascular: Irregularly irregular. Respiratory: Lungs clear to auscultation bilaterally.  No wheezes, no crackles, no rhonchi.  Fair air movement.  Speaking in full sentences.  Discharge Instructions   Discharge Instructions     Amb referral to AFIB Clinic   Complete by: As directed    Diet - low sodium heart healthy   Complete by: As directed    Increase activity slowly   Complete by: As directed       Allergies as of 06/15/2022   No Known Allergies      Medication List     STOP taking these medications    amoxicillin-clavulanate 875-125 MG tablet Commonly known as: AUGMENTIN   aspirin 81 MG tablet   atenolol 25 MG tablet Commonly known as: TENORMIN   ibuprofen 200 MG tablet Commonly known as: ADVIL   Potassium Citrate 15 MEQ (1620 MG) Tbcr       TAKE these medications    acetaminophen 325 MG tablet Commonly known as: TYLENOL Take 2 tablets (650 mg total) by mouth every 4 (four) hours as needed for headache or mild pain.   apixaban 5 MG Tabs tablet Commonly known as: ELIQUIS Take 1 tablet (5 mg total) by mouth 2 (two) times daily.   atorvastatin 20 MG tablet Commonly known as: LIPITOR Take 20 mg by mouth daily.   diltiazem 120 MG 24 hr capsule Commonly known as: CARDIZEM CD Take 1 capsule (120 mg total) by mouth daily. Start taking on: June 16, 2022   guaiFENesin 600 MG 12 hr tablet Commonly known as:  MUCINEX Take 600 mg by mouth 2 (two) times daily as needed for cough.   metFORMIN 500 MG 24 hr tablet Commonly known as: GLUCOPHAGE-XR Take 1,000 mg by mouth 2 (two) times daily.   Ozempic (0.25 or 0.5 MG/DOSE) 2 MG/3ML Sopn Generic drug: Semaglutide(0.25 or 0.5MG /DOS) Inject 0.25 mg into the skin once a week.       No Known Allergies  Follow-up Information     Dyane Dustman Renford Dills., MD Follow up in 1 week(s).   Specialty: Cardiology Why: Follow-up in 1 to 2 weeks. Contact information: [address] Dr Laurell Josephs [address] [place] [address] [phone]         Loyal Jacobson, MD. Schedule an appointment as soon as possible for a visit in 3 week(s).   Specialty: Family Medicine Contact information: [address] [place] [address] [phone]                  The results of significant diagnostics from this hospitalization (including imaging, microbiology, ancillary and laboratory) are listed below for reference.    Significant Diagnostic Studies: ECHOCARDIOGRAM COMPLETE  Result Date: 06/15/2022    ECHOCARDIOGRAM REPORT   Patient Name:   Peter Peters Date of Exam: 06/15/2022 Medical  Rec #:  [id number]        Height:       72.0 in Accession #:    [id number]       Weight:       230.0 lb Date of Birth:  1950-08-20        BSA:          2.261 m Patient Age:    71 years         BP:           132/82 mmHg Patient Gender: M                HR:           63 bpm. Exam Location:  Inpatient Procedure: 2D Echo, Cardiac Doppler, Color Doppler and Intracardiac            Opacification Agent Indications:    R94.31 Abnormal EKG  History:        Patient has no prior history of Echocardiogram examinations.                 Arrythmias:Atrial Fibrillation; Risk Factors:Hypertension.  Sonographer:    Mike Gip Referring Phys: [id number] MOHAMMAD L GARBA IMPRESSIONS  1. Left ventricular ejection fraction, by estimation, is 55 to 60%. The left ventricle has normal  function. The left ventricle has no regional wall motion abnormalities. There is mild left ventricular hypertrophy. Left ventricular diastolic function could not be evaluated.  2. Right ventricular systolic function is normal. The right ventricular size is normal.  3. The mitral valve is normal in structure. Trivial mitral valve regurgitation. No evidence of mitral stenosis.  4. The aortic valve is tricuspid. Aortic valve regurgitation is not visualized. Aortic valve sclerosis is present, with no evidence of aortic valve stenosis.  5. The inferior vena cava is normal in size with greater than 50% respiratory variability, suggesting right atrial pressure of 3 mmHg. Comparison(s): No prior Echocardiogram. FINDINGS  Left Ventricle: Left ventricular ejection fraction, by estimation, is 55 to 60%. The left ventricle has normal function. The left ventricle has no regional wall motion abnormalities. Definity contrast agent was given IV to delineate the left ventricular  endocardial borders. The left ventricular internal cavity size was normal in size. There is mild left ventricular hypertrophy. Left ventricular diastolic function could not be evaluated due to atrial fibrillation. Left ventricular diastolic function could not be evaluated. Right Ventricle: The right ventricular size is normal. Right ventricular systolic function is normal. Left Atrium: Left atrial size was normal in size. Right Atrium: Right atrial size was normal in size. Pericardium: There is no evidence of pericardial effusion. Mitral Valve: The mitral valve is normal in structure. Trivial mitral valve regurgitation. No evidence of mitral valve stenosis. Tricuspid Valve: The tricuspid valve is normal in structure. Tricuspid valve regurgitation is trivial. No evidence of tricuspid stenosis. Aortic Valve: The aortic valve is tricuspid. Aortic valve regurgitation is not visualized. Aortic valve sclerosis is present, with no evidence of aortic valve  stenosis. Pulmonic Valve: The pulmonic valve was not well visualized. Pulmonic valve regurgitation is not visualized. No evidence of pulmonic stenosis. Aorta: The aortic root is normal in size and structure. Venous: The inferior vena cava is normal in size with greater than 50% respiratory variability, suggesting right atrial pressure of 3 mmHg. IAS/Shunts: No atrial level shunt detected by color flow Doppler.  LEFT VENTRICLE PLAX 2D LVIDd:         4.60 cm  Diastology LVIDs:         3.20 cm      LV e' medial:    10.40 cm/s LV PW:         1.20 cm      LV E/e' medial:  7.6 LV IVS:        1.10 cm      LV e' lateral:   10.90 cm/s LVOT diam:     2.20 cm      LV E/e' lateral: 7.2 LV SV:         50 LV SV Index:   22 LVOT Area:     3.80 cm  LV Volumes (MOD) LV vol d, MOD A2C: 107.0 ml LV vol d, MOD A4C: 106.0 ml LV vol s, MOD A2C: 42.9 ml LV vol s, MOD A4C: 44.1 ml LV SV MOD A2C:     64.1 ml LV SV MOD A4C:     106.0 ml LV SV MOD BP:      63.8 ml RIGHT VENTRICLE             IVC RV Basal diam:  4.00 cm     IVC diam: 1.60 cm RV S prime:     15.20 cm/s TAPSE (M-mode): 2.6 cm LEFT ATRIUM             Index        RIGHT ATRIUM           Index LA diam:        4.10 cm 1.81 cm/m   RA Area:     16.10 cm LA Vol (A2C):   64.1 ml 28.35 ml/m  RA Volume:   42.20 ml  18.66 ml/m LA Vol (A4C):   63.7 ml 28.17 ml/m LA Biplane Vol: 67.0 ml 29.63 ml/m  AORTIC VALVE LVOT Vmax:   66.60 cm/s LVOT Vmean:  47.800 cm/s LVOT VTI:    0.132 m  AORTA Ao Root diam: 3.70 cm Ao Asc diam:  3.20 cm MITRAL VALVE MV Area (PHT): 4.49 cm    SHUNTS MV Decel Time: 169 msec    Systemic VTI:  0.13 m MV E velocity: 78.80 cm/s  Systemic Diam: 2.20 cm MV A velocity: 65.10 cm/s MV E/A ratio:  1.21 Kirk Ruths MD Electronically signed by Kirk Ruths MD Signature Date/Time: 06/15/2022/3:05:00 PM    Final    DG Chest 2 View  Result Date: 06/14/2022 CLINICAL DATA:  chest pain EXAM: CHEST - 2 VIEW COMPARISON:  Chest x-ray 11/17/2020, CT chest 10 8 in 19  FINDINGS: The heart and mediastinal contours are within normal limits. No focal consolidation. No pulmonary edema. No pleural effusion. No pneumothorax. No acute osseous abnormality. IMPRESSION: No active cardiopulmonary disease. Electronically Signed   By: Iven Finn M.D.   On: 06/14/2022 01:07    Microbiology: No results found for this or any previous visit (from the past 240 hour(s)).   Labs: Basic Metabolic Panel: Recent Labs  Lab 06/14/22 0038 06/14/22 0226 06/15/22 0453  NA 134*  --  134*  K 3.5  --  4.3  CL 104  --  105  CO2 20*  --  23  GLUCOSE 169*  --  157*  BUN 16  --  15  CREATININE 0.86  --  0.94  CALCIUM 8.6*  --  8.7*  MG  --  2.0  --    Liver Function Tests: No results for input(s): "AST", "ALT", "ALKPHOS", "BILITOT", "PROT", "ALBUMIN" in the last 168  hours. No results for input(s): "LIPASE", "AMYLASE" in the last 168 hours. No results for input(s): "AMMONIA" in the last 168 hours. CBC: Recent Labs  Lab 06/14/22 0038 06/15/22 0453  WBC 9.4 9.7  HGB 15.7 16.4  HCT 44.7 47.5  MCV 85.6 86.7  PLT 230 239   Cardiac Enzymes: No results for input(s): "CKTOTAL", "CKMB", "CKMBINDEX", "TROPONINI" in the last 168 hours. BNP: BNP (last 3 results) No results for input(s): "BNP" in the last 8760 hours.  ProBNP (last 3 results) No results for input(s): "PROBNP" in the last 8760 hours.  CBG: Recent Labs  Lab 06/15/22 0810 06/15/22 1235  GLUCAP 154* 146*       Signed:  Ramiro Harvest MD.  [facility] 06/15/2022, 4:18 PM

## 2022-06-15 NOTE — Discharge Instructions (Signed)

## 2022-06-15 NOTE — TOC Initial Note (Signed)
Transition of Care [facility]) - Initial/Assessment Note    Patient Details  Name: Peter Peters MRN: [id number] Date of Birth: 1950/09/11  Transition of Care [facility]) CM/SW Contact:    Leeroy Cha, RN Phone Number: 06/15/2022, 8:16 AM  Clinical Narrative:                  Transition of Care [facility]) Screening Note   Patient Details  Name: Peter Peters Date of Birth: March 17, 1951   Transition of Care [facility]) CM/SW Contact:    Leeroy Cha, RN Phone Number: 06/15/2022, 8:16 AM    Transition of Care Department [facility]) has reviewed patient and no TOC needs have been identified at this time. We will continue to monitor patient advancement through interdisciplinary progression rounds. If new patient transition needs arise, please place a TOC consult.    Expected Discharge Plan: Home/Self Care Barriers to Discharge: Continued Medical Work up   Patient Goals and CMS Choice Patient states their goals for this hospitalization and ongoing recovery are:: to get better and go home   Choice offered to / list presented to : Patient      Expected Discharge Plan and Services   Discharge Planning Services: CM Consult   Living arrangements for the past 2 months: [place]                                      Prior Living Arrangements/Services Living arrangements for the past 2 months: Single Family Home Lives with:: Spouse Patient language and need for interpreter reviewed:: Yes Do you feel safe going back to the place where you live?: Yes            Criminal Activity/Legal Involvement Pertinent to Current Situation/Hospitalization: No - Comment as needed  Activities of Daily Living      Permission Sought/Granted                  Emotional Assessment   Attitude/Demeanor/Rapport: Engaged Affect (typically observed): Appropriate Orientation: : Oriented to Self, Oriented to Place, Oriented to  Time, Oriented to Situation Alcohol / Substance  Use: Tobacco Use, Alcohol Use (current has quit smoking, occassional etoh intake)    Admission diagnosis:  Atrial fibrillation with rapid ventricular response ([place]) [I48.91] Atrial flutter with rapid ventricular response ([place]) [I48.92] Patient Active Problem List   Diagnosis Date Noted   Atrial flutter with rapid ventricular response ([place]) 06/14/2022   Hyponatremia 06/14/2022   Benign prostatic hyperplasia with urinary frequency 07/17/2019   Pre-diabetes 07/17/2019   Mixed hyperlipidemia 11/09/2017   PCP:  Jefm Petty, MD Pharmacy:   [id number] - [place], [place] - [address] [address] Phone: [phone] Fax: [phone]     Social Determinants of Health ([place]) Social History: SDOH Screenings   Tobacco Use: Medium Risk (06/14/2022)   SDOH Interventions:     Readmission Risk Interventions   No data to display

## 2022-06-15 NOTE — Progress Notes (Signed)
Mobility Specialist - Progress Note   06/15/22 1304  Mobility  Activity Ambulated independently in hallway  Level of Assistance Independent  Assistive Device None  Distance Ambulated (ft) 500 ft  Activity Response Tolerated well  Mobility Referral Yes  $Mobility charge 1 Mobility   Pt received in bed and agreeable to mobility. No complaints during mobility. Pt to bed after session with all needs met.   [facility]

## 2023-08-07 ENCOUNTER — Other Ambulatory Visit: Payer: Self-pay | Admitting: Student

## 2023-08-07 DIAGNOSIS — M79672 Pain in left foot: Secondary | ICD-10-CM
